# Patient Record
Sex: Female | Born: 1970 | Race: Asian | Hispanic: No | Marital: Married | Smoking: Current every day smoker
Health system: Southern US, Community
[De-identification: ages and names within clinical notes are randomized; demographics above are authoritative.]

## PROBLEM LIST (undated history)

## (undated) DIAGNOSIS — F329 Major depressive disorder, single episode, unspecified: Secondary | ICD-10-CM

## (undated) DIAGNOSIS — E119 Type 2 diabetes mellitus without complications: Secondary | ICD-10-CM

## (undated) DIAGNOSIS — R011 Cardiac murmur, unspecified: Secondary | ICD-10-CM

## (undated) DIAGNOSIS — I10 Essential (primary) hypertension: Secondary | ICD-10-CM

## (undated) DIAGNOSIS — F410 Panic disorder [episodic paroxysmal anxiety] without agoraphobia: Secondary | ICD-10-CM

## (undated) DIAGNOSIS — I350 Nonrheumatic aortic (valve) stenosis: Secondary | ICD-10-CM

## (undated) DIAGNOSIS — F429 Obsessive-compulsive disorder, unspecified: Secondary | ICD-10-CM

## (undated) DIAGNOSIS — R519 Headache, unspecified: Secondary | ICD-10-CM

## (undated) DIAGNOSIS — F419 Anxiety disorder, unspecified: Secondary | ICD-10-CM

## (undated) DIAGNOSIS — F909 Attention-deficit hyperactivity disorder, unspecified type: Secondary | ICD-10-CM

## (undated) DIAGNOSIS — F411 Generalized anxiety disorder: Secondary | ICD-10-CM

## (undated) DIAGNOSIS — E1165 Type 2 diabetes mellitus with hyperglycemia: Secondary | ICD-10-CM

## (undated) DIAGNOSIS — F4310 Post-traumatic stress disorder, unspecified: Secondary | ICD-10-CM

## (undated) DIAGNOSIS — K5900 Constipation, unspecified: Secondary | ICD-10-CM

## (undated) DIAGNOSIS — M1990 Unspecified osteoarthritis, unspecified site: Secondary | ICD-10-CM

## (undated) HISTORY — DX: Nonrheumatic aortic (valve) stenosis: I35.0

## (undated) HISTORY — DX: Essential (primary) hypertension: I10

## (undated) HISTORY — DX: Generalized anxiety disorder: F41.1

## (undated) HISTORY — DX: Type 2 diabetes mellitus without complications: E11.9

## (undated) HISTORY — PX: WISDOM TOOTH EXTRACTION: SHX21

## (undated) HISTORY — DX: Major depressive disorder, single episode, unspecified: F32.9

## (undated) HISTORY — DX: Cardiac murmur, unspecified: R01.1

## (undated) HISTORY — DX: Post-traumatic stress disorder, unspecified: F43.10

## (undated) HISTORY — DX: Type 2 diabetes mellitus with hyperglycemia: E11.65

---

## 2001-11-10 ENCOUNTER — Encounter: Admission: RE | Admit: 2001-11-10 | Discharge: 2001-11-10 | Payer: Self-pay | Admitting: Internal Medicine

## 2003-01-06 ENCOUNTER — Encounter: Admission: RE | Admit: 2003-01-06 | Discharge: 2003-04-06 | Payer: Self-pay | Admitting: Family Medicine

## 2003-05-25 ENCOUNTER — Encounter: Admission: RE | Admit: 2003-05-25 | Discharge: 2003-06-16 | Payer: Self-pay | Admitting: Family Medicine

## 2006-01-03 ENCOUNTER — Emergency Department (HOSPITAL_COMMUNITY): Admission: EM | Admit: 2006-01-03 | Discharge: 2006-01-03 | Payer: Self-pay | Admitting: Emergency Medicine

## 2008-11-15 ENCOUNTER — Encounter: Payer: Self-pay | Admitting: Cardiology

## 2008-12-16 ENCOUNTER — Ambulatory Visit (HOSPITAL_COMMUNITY): Admission: RE | Admit: 2008-12-16 | Discharge: 2008-12-16 | Payer: Self-pay | Admitting: Obstetrics and Gynecology

## 2009-01-10 ENCOUNTER — Ambulatory Visit (HOSPITAL_COMMUNITY): Admission: RE | Admit: 2009-01-10 | Discharge: 2009-01-10 | Payer: Self-pay | Admitting: Obstetrics and Gynecology

## 2009-01-12 DIAGNOSIS — I359 Nonrheumatic aortic valve disorder, unspecified: Secondary | ICD-10-CM | POA: Insufficient documentation

## 2009-01-12 HISTORY — DX: Nonrheumatic aortic valve disorder, unspecified: I35.9

## 2009-01-16 DIAGNOSIS — I10 Essential (primary) hypertension: Secondary | ICD-10-CM | POA: Insufficient documentation

## 2009-01-16 DIAGNOSIS — E119 Type 2 diabetes mellitus without complications: Secondary | ICD-10-CM

## 2009-01-17 ENCOUNTER — Ambulatory Visit: Payer: Self-pay | Admitting: Cardiology

## 2009-01-31 ENCOUNTER — Ambulatory Visit (HOSPITAL_COMMUNITY): Admission: RE | Admit: 2009-01-31 | Discharge: 2009-01-31 | Payer: Self-pay | Admitting: Obstetrics and Gynecology

## 2009-02-09 ENCOUNTER — Inpatient Hospital Stay (HOSPITAL_COMMUNITY): Admission: AD | Admit: 2009-02-09 | Discharge: 2009-02-09 | Payer: Self-pay | Admitting: Obstetrics and Gynecology

## 2009-02-24 ENCOUNTER — Ambulatory Visit (HOSPITAL_COMMUNITY): Admission: RE | Admit: 2009-02-24 | Discharge: 2009-02-24 | Payer: Self-pay | Admitting: Obstetrics and Gynecology

## 2009-03-03 ENCOUNTER — Inpatient Hospital Stay (HOSPITAL_COMMUNITY): Admission: AD | Admit: 2009-03-03 | Discharge: 2009-03-03 | Payer: Self-pay | Admitting: Obstetrics and Gynecology

## 2009-03-06 ENCOUNTER — Inpatient Hospital Stay (HOSPITAL_COMMUNITY): Admission: AD | Admit: 2009-03-06 | Discharge: 2009-03-09 | Payer: Self-pay | Admitting: Obstetrics and Gynecology

## 2009-03-07 ENCOUNTER — Encounter (HOSPITAL_COMMUNITY): Payer: Self-pay | Admitting: Obstetrics and Gynecology

## 2010-02-12 ENCOUNTER — Encounter (INDEPENDENT_AMBULATORY_CARE_PROVIDER_SITE_OTHER): Payer: Self-pay | Admitting: *Deleted

## 2010-04-08 ENCOUNTER — Encounter: Payer: Self-pay | Admitting: Obstetrics

## 2010-04-17 NOTE — Letter (Signed)
Summary: Appointment - Reminder 2  [facility] [place]. [address], [place] [address]   Phone: [phone]  Fax: [phone]     February 12, 2010 MRN: [id number]   JENNALYN CAWLEY [address] [place], [place]  [address]   Dear Ms. Drue Dun,  Our records indicate that it is time to schedule a follow-up appointment.  Dr.    Daleen Squibb      recommended that you follow up with [place] in  01/2010 PAST DUE          . It is very important that we reach you to schedule this appointment. We look forward to participating in your health care needs. Please contact us at the number listed above at your earliest convenience to schedule your appointment.  If you are unable to make an appointment at this time, give [place] a call so we can update our records.     Sincerely,   Glass blower/designer

## 2010-06-18 LAB — LACTATE DEHYDROGENASE: LDH: 134 U/L (ref 94–250)

## 2010-06-18 LAB — CBC
HCT: 38.5 % (ref 36.0–46.0)
HCT: 40.3 % (ref 36.0–46.0)
MCHC: 33.3 g/dL (ref 30.0–36.0)
MCHC: 33.5 g/dL (ref 30.0–36.0)
MCHC: 34.5 g/dL (ref 30.0–36.0)
MCV: 80.8 fL (ref 78.0–100.0)
MCV: 81.4 fL (ref 78.0–100.0)
Platelets: 208 10*3/uL (ref 150–400)
Platelets: 214 10*3/uL (ref 150–400)
RBC: 3.89 MIL/uL (ref 3.87–5.11)
RBC: 4.77 MIL/uL (ref 3.87–5.11)
RDW: 14.7 % (ref 11.5–15.5)
WBC: 14.4 10*3/uL — ABNORMAL HIGH (ref 4.0–10.5)

## 2010-06-18 LAB — GLUCOSE, CAPILLARY
Glucose-Capillary: 102 mg/dL — ABNORMAL HIGH (ref 70–99)
Glucose-Capillary: 114 mg/dL — ABNORMAL HIGH (ref 70–99)
Glucose-Capillary: 114 mg/dL — ABNORMAL HIGH (ref 70–99)
Glucose-Capillary: 138 mg/dL — ABNORMAL HIGH (ref 70–99)
Glucose-Capillary: 79 mg/dL (ref 70–99)
Glucose-Capillary: 81 mg/dL (ref 70–99)
Glucose-Capillary: 95 mg/dL (ref 70–99)

## 2010-06-18 LAB — COMPREHENSIVE METABOLIC PANEL
AST: 21 U/L (ref 0–37)
BUN: 9 mg/dL (ref 6–23)
CO2: 23 mEq/L (ref 19–32)
Calcium: 8.8 mg/dL (ref 8.4–10.5)
Creatinine, Ser: 0.42 mg/dL (ref 0.4–1.2)
GFR calc Af Amer: >60 mL/min (ref >60)
GFR calc non Af Amer: >60 mL/min (ref >60)
Glucose, Bld: 158 mg/dL — ABNORMAL HIGH (ref 70–99)

## 2010-06-18 LAB — URINE MICROSCOPIC-ADD ON

## 2010-06-18 LAB — URINALYSIS, ROUTINE W REFLEX MICROSCOPIC
Protein, ur: 100 mg/dL — AB
Urobilinogen, UA: 0.2 mg/dL (ref 0.0–1.0)

## 2010-06-18 LAB — URIC ACID: Uric Acid, Serum: 4.9 mg/dL (ref 2.4–7.0)

## 2010-06-18 LAB — RPR: RPR Ser Ql: NONREACTIVE

## 2010-09-25 ENCOUNTER — Encounter: Payer: Self-pay | Admitting: Cardiology

## 2013-09-15 DIAGNOSIS — F4322 Adjustment disorder with anxiety: Secondary | ICD-10-CM | POA: Insufficient documentation

## 2013-09-15 DIAGNOSIS — M479 Spondylosis, unspecified: Secondary | ICD-10-CM | POA: Insufficient documentation

## 2013-09-15 DIAGNOSIS — L811 Chloasma: Secondary | ICD-10-CM

## 2013-09-15 DIAGNOSIS — L853 Xerosis cutis: Secondary | ICD-10-CM

## 2013-09-15 DIAGNOSIS — D239 Other benign neoplasm of skin, unspecified: Secondary | ICD-10-CM

## 2013-09-15 DIAGNOSIS — E669 Obesity, unspecified: Secondary | ICD-10-CM

## 2013-09-15 DIAGNOSIS — I517 Cardiomegaly: Secondary | ICD-10-CM | POA: Insufficient documentation

## 2013-09-15 HISTORY — DX: Spondylosis, unspecified: M47.9

## 2013-09-15 HISTORY — DX: Obesity, unspecified: E66.9

## 2013-09-15 HISTORY — DX: Chloasma: L81.1

## 2013-09-15 HISTORY — DX: Xerosis cutis: L85.3

## 2013-09-15 HISTORY — DX: Cardiomegaly: I51.7

## 2013-09-15 HISTORY — DX: Other benign neoplasm of skin, unspecified: D23.9

## 2013-09-15 HISTORY — DX: Adjustment disorder with anxiety: F43.22

## 2013-12-09 DIAGNOSIS — L989 Disorder of the skin and subcutaneous tissue, unspecified: Secondary | ICD-10-CM | POA: Insufficient documentation

## 2013-12-09 HISTORY — DX: Disorder of the skin and subcutaneous tissue, unspecified: L98.9

## 2016-03-13 DIAGNOSIS — R1084 Generalized abdominal pain: Secondary | ICD-10-CM | POA: Insufficient documentation

## 2016-03-13 DIAGNOSIS — J208 Acute bronchitis due to other specified organisms: Secondary | ICD-10-CM

## 2016-03-13 HISTORY — DX: Acute bronchitis due to other specified organisms: J20.8

## 2016-03-13 HISTORY — DX: Generalized abdominal pain: R10.84

## 2016-04-18 DIAGNOSIS — H40013 Open angle with borderline findings, low risk, bilateral: Secondary | ICD-10-CM | POA: Insufficient documentation

## 2016-04-18 HISTORY — DX: Open angle with borderline findings, low risk, bilateral: H40.013

## 2017-06-05 DIAGNOSIS — Z79899 Other long term (current) drug therapy: Secondary | ICD-10-CM

## 2017-06-05 HISTORY — DX: Other long term (current) drug therapy: Z79.899

## 2017-06-27 DIAGNOSIS — F5101 Primary insomnia: Secondary | ICD-10-CM

## 2017-06-27 HISTORY — DX: Primary insomnia: F51.01

## 2019-05-08 DIAGNOSIS — S0990XA Unspecified injury of head, initial encounter: Secondary | ICD-10-CM | POA: Insufficient documentation

## 2019-05-08 HISTORY — DX: Unspecified injury of head, initial encounter: S09.90XA

## 2019-05-20 DIAGNOSIS — M546 Pain in thoracic spine: Secondary | ICD-10-CM | POA: Insufficient documentation

## 2019-05-20 HISTORY — DX: Pain in thoracic spine: M54.6

## 2019-06-11 ENCOUNTER — Ambulatory Visit: Payer: Self-pay | Admitting: Orthopedic Surgery

## 2019-06-11 NOTE — H&P (Signed)
Subjective:   Location: right (forearm) Duration: DOI- 05-08-19 Quality: burning; worsening Severity: pain level 12/10 (in right arm) Context: work injury (rolled down steps) Alleviating Factors: nothing helps; Ibu, Voltaren gel, bed rest for 3 weeks Aggravating Factors: walking Associated Symptoms: weakness (in general); numbness (right arm) Previous Surgery: none (for neck or back) Prior Imaging: MRI (@ [facility]) Previous PT: none Work Related: yes Working: no Dr Tonita Cong referral  Patient Active Problem List   Diagnosis Date Noted  . DIABETES MELLITUS, TYPE II, ON INSULIN, CONTROLLED 01/16/2009  . HYPERTENSION, ESSENTIAL, CHRONIC 01/16/2009  . AORTIC STENOSIS 01/12/2009   Past Medical History:  Diagnosis Date  . Aortic stenosis   . DM2 (diabetes mellitus, type 2)    on insulin, controlled  . Unspecified essential hypertension      Current Outpatient Medications  Medication Sig Dispense Refill Last Dose  . calcium carbonate (CALCIUM 500) 1250 MG tablet Take 1 tablet by mouth daily.       . insulin glargine (LANTUS SOLOSTAR) 100 UNIT/ML injection Inject into the skin as directed.       . insulin lispro (HUMALOG KWIKPEN) 100 UNIT/ML injection Inject into the skin as directed.       . metFORMIN (GLUCOPHAGE-XR) 750 MG 24 hr tablet Take 750 mg by mouth 3 (three) times daily.       Marland Kitchen NIFEdipine (PROCARDIA XL) 30 MG (OSM) 24 hr tablet Take 30 mg by mouth at bedtime.       Marland Kitchen NIFEdipine (PROCARDIA XL) 90 MG (OSM) 24 hr tablet Take 90 mg by mouth every morning.       . Omega-3 Fatty Acids (FISH OIL) 1000 MG CAPS Take by mouth daily.       . Prenatal Multivit-Min-Fe-FA (PRENATAL VITAMINS) 0.8 MG tablet Take 1 tablet by mouth daily.        No current facility-administered medications for this visit.   Not on File  Social History   Tobacco Use  . Smoking status: Not on file  Substance Use Topics  . Alcohol use: Yes    Comment: Not during pregnancy though     Family History  Problem  Relation Age of Onset  . Hypertension Mother   . Coronary artery disease Father   . Coronary artery disease Paternal Aunt   . Coronary artery disease Paternal Aunt   . Cancer Other     Review of Systems As stated in HPI  Objective:   Vitals: Ht: 4 ft 8 in Wt: 142 lbs BMI: 31.8  Clinical exam: Cynthia Wilkinson is a pleasant individual, who appears younger than their stated age. She is alert and orientated 3. No shortness of breath, chest pain. Abdomen is soft and non-tender, negative loss of bowel and bladder control, no rebound tenderness. Negative: skin lesions abrasions contusions  Lungs: Clear to auscultation bilaterally  Cardiac: Regular rate and rhythm. No rubs gallops murmurs  Peripheral pulses: 2+ radial, dorsalis pedis, posterior tibialis pulses bilaterally. Compartment soft and nontender.  Gait pattern: Ataxic gait pattern unable to ambulate heel toe  Assistive devices: Currently using a cane for balance.  Neuro: Right upper extremity: 5/5 deltoid strength, 4/5 bicep, wrist extensor, and tricep strength. 5/5 upper extremity strength in the left arm except for trace weakness of grip strength bilaterally. Babinski test, positive Hoffman test, brisk lower extremity deep tendon reflexes at the knee and Achilles, positive clonus in the lower extremity. Negative inverted brachioradialis reflex. Diminished proprioception in all extremities.  Musculoskeletal: Significant neck pain especially with forward  flexion. Patient notes upper and lower extremity Lhermitte sign with forward flexion of the neck. No significant shoulder, elbow, wrist pain with isolated joint range of motion.  X-rays of the cervical spine taken on 05/21/19 were reviewed. Loss of normal cervical lordosis is noted. She has overall slightly kyphotic angulation apex is at C5-6. There is mild degenerative changes at the C5-6 disc space level. No significant collapse of the C3-5 disks. Unable to visualize the C7-T1  junction.  Cervical MRI: completed on 06/09/19 was reviewed with the patient. It was completed at [facility]; I have independently reviewed the images as well as the radiology report. Positive cord signal change consistent with myelomalacia at the C5-6 disc space level. Loss of normal cervical lordosis is noted. Slight grade 1 retrolisthesis C5 on 6. No disc herniation or stenosis C2-C5. Large right paracentral disc extrusion C5-6 causing compression of the canal and the exiting C6 nerve root. Moderate right paracentral disc extrusion C6-7 but no foraminal stenosis. There is still flattening of the ventral cord from C5-C7.   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 Since this time she is noted progressive weakness in the right upper extremity as well as radicular pain, and difficulty walking. Her balance has deteriorated and over the last 2 weeks she is now using a cane. Imaging studies demonstrated a large disc herniation at C5-6 and slightly smaller at C6-7. There is significant cord edema consistent with myelomalacia at the C5-6 level. She does have a central disc protrusion at C4-5 but there is still CSF signal anterior to the cord and she does not have any C5 radicular symptoms. Clinically the patient has signs and symptoms of cervical myelopathy that has progressed since the date of her injury. I have gone over the pathology with her and her husband in great detail. The natural history of myelopathy is progression as she has demonstrated over the last 3 weeks. The goal of surgery is to remove the pressure from the cervical spine and prevent worsening of her symptoms. I indicated that there may be a chance that she can improve but the goal is to keep her from progressively deteriorating.  Plan:   Risks and benefits of surgery were discussed with the patient. These include: Infection, bleeding, death, stroke, paralysis, ongoing or worse  pain, need for additional surgery, nonunion, leak of spinal fluid, adjacent segment degeneration requiring additional fusion surgery. Pseudoarthrosis (nonunion)requiring supplemental posterior fixation. Throat pain, swallowing difficulties, hoarseness or change in voice.  Postoperatively, since this is a multilevel procedure and she has a history of nicotine use as well as diabetes we will use postoperative external bone stimulator. She will require a Aspen collar for 4-6 weeks, and it is unlikely that she will be able to return to gainful employment for a minimum of 3 months. I would expect her to be at [facility] at approximately one year.  Treatment plan: We will obtain preoperative medical clearance from her primary care physician and move forward with surgery next week.

## 2019-06-11 NOTE — H&P (Deleted)
  The note originally documented on this encounter has been moved the the encounter in which it belongs.  

## 2019-06-14 ENCOUNTER — Encounter (HOSPITAL_COMMUNITY): Payer: Self-pay

## 2019-06-14 NOTE — Progress Notes (Signed)
[id number] - [place], [place] - [address] AT [place] [address] Phone: [phone] Fax: [phone]    Your procedure is scheduled on Thursday, April 1st.  Report to [facility] Main Entrance "A" at 10:00 A.M., and check in at the Admitting office.  Call this number if you have problems the morning of surgery:  [phone]  Call [phone] if you have any questions prior to your surgery date Monday-Friday 8am-4pm   Remember:  Do not eat or drink after midnight the night before your surgery   Take these medicines the morning of surgery with A SIP OF WATER  metoprolol tartrate (LOPRESSOR)   If needed - ALPRAZolam [name]   Follow your surgeon's instructions on when to stop Aspirin.  If no instructions were given by your surgeon then you will need to call the office to get those instructions.     As of today, STOP taking other Aspirin containing products, diclofenac Sodium (VOLTAREN), Vitamins, Fish Oils, and Herbal Medications. Also stop all NSAIDS i.e. Advil, Ibuprofen, Motrin, Aleve, Anaprox, Naproxen, BC, Goody Powders, and all Supplements.   WHAT DO I DO ABOUT MY DIABETES MEDICATION?  Marland Kitchen Do not take oral diabetes medicines (pills) the morning of surgery.  . The day of surgery, do not take other diabetes injectables, including Victoza (liraglutide), Byetta (exenatide), Bydureon (exenatide ER), or Trulicity (dulaglutide).  . If your CBG is greater than 220 mg/dL, you may take  of your sliding scale (correction) dose of insulin.  HOW TO MANAGE YOUR DIABETES BEFORE AND AFTER SURGERY  Why is it important to control my blood sugar before and after surgery? . Improving blood sugar levels before and after surgery helps healing and can limit problems. . A way of improving blood sugar control is eating a healthy diet by: o  Eating less sugar and carbohydrates o  Increasing activity/exercise o  Talking with your  doctor about reaching your blood sugar goals . High blood sugars (greater than 180 mg/dL) can raise your risk of infections and slow your recovery, so you will need to focus on controlling your diabetes during the weeks before surgery. . Make sure that the doctor who takes care of your diabetes knows about your planned surgery including the date and location.  How do I manage my blood sugar before surgery? . Check your blood sugar at least 4 times a day, starting 2 days before surgery, to make sure that the level is not too high or low. . Check your blood sugar the morning of your surgery when you wake up and every 2 hours until you get to the Short Stay unit. o If your blood sugar is less than 70 mg/dL, you will need to treat for low blood sugar: - Do not take insulin. - Treat a low blood sugar (less than 70 mg/dL) with  cup of clear juice (cranberry or apple), 4 glucose tablets, OR glucose gel. - Recheck blood sugar in 15 minutes after treatment (to make sure it is greater than 70 mg/dL). If your blood sugar is not greater than 70 mg/dL on recheck, call [phone] for further instructions. . Report your blood sugar to the short stay nurse when you get to Short Stay.  . If you are admitted to the hospital after surgery: o Your blood sugar will be checked by the staff and you will probably be given insulin after surgery (instead of oral diabetes medicines) to make sure you  have good blood sugar levels. o The goal for blood sugar control after surgery is 80-180 mg/dL.  No Smoking of any kind, Tobacco, or Alcohol products 24 hours prior to your procedure. If you use a CPAP at night, you may bring all equipment for your overnight stay.                        Do not wear jewelry, make up, or nail polish            Do not wear lotions, powders, perfumes, or deodorant.            Do not shave 48 hours prior to surgery.              Do not bring valuables to the hospital.            [facility]  is not responsible for any belongings or valuables.   Contacts, glasses, dentures or bridgework may not be worn into surgery.      For patients admitted to the hospital, discharge time will be determined by your treatment team.   Patients discharged the day of surgery will not be allowed to drive home, and someone needs to stay with them for 24 hours.  Special instructions:   [facility]- Preparing For Surgery  Before surgery, you can play an important role. Because skin is not sterile, your skin needs to be as free of germs as possible. You can reduce the number of germs on your skin by washing with CHG (chlorahexidine gluconate) Soap before surgery.  CHG is an antiseptic cleaner which kills germs and bonds with the skin to continue killing germs even after washing.    Oral Hygiene is also important to reduce your risk of infection.  Remember - BRUSH YOUR TEETH THE MORNING OF SURGERY WITH YOUR REGULAR TOOTHPASTE  Please do not use if you have an allergy to CHG or antibacterial soaps. If your skin becomes reddened/irritated stop using the CHG.  Do not shave (including legs and underarms) for at least 48 hours prior to first CHG shower. It is OK to shave your face.  Please follow these instructions carefully.   1. Shower the NIGHT BEFORE SURGERY and the MORNING OF SURGERY with CHG Soap.   2. If you chose to wash your hair, wash your hair first as usual with your normal shampoo.  3. After you shampoo, rinse your hair and body thoroughly to remove the shampoo.  4. Use CHG as you would any other liquid soap. You can apply CHG directly to the skin and wash gently with a scrungie or a clean washcloth.   5. Apply the CHG Soap to your body ONLY FROM THE NECK DOWN.  Do not use on open wounds or open sores. Avoid contact with your eyes, ears, mouth and genitals (private parts). Wash Face and genitals (private parts)  with your normal soap.   6. Wash thoroughly, paying special attention to the  area where your surgery will be performed.  7. Thoroughly rinse your body with warm water from the neck down.  8. DO NOT shower/wash with your normal soap after using and rinsing off the CHG Soap.  9. Pat yourself dry with a CLEAN TOWEL.  10. Wear CLEAN PAJAMAS to bed the night before surgery, wear comfortable clothes the morning of surgery  11. Place CLEAN SHEETS on your bed the night of your first shower and DO NOT SLEEP WITH PETS.  Day of Surgery:  Do not apply any deodorants/lotions.  Please wear clean clothes to the hospital/surgery center.   Remember to brush your teeth WITH YOUR REGULAR TOOTHPASTE.   Please read over the following fact sheets that you were given.

## 2019-06-14 NOTE — Progress Notes (Signed)
Your procedure is scheduled on Thursday, April 1st.  Report to [facility] Main Entrance "A" at 10:00 A.M., and check in at the Admitting office. Your surgery or procedure is scheduled for 12 noon.   Call this number if you have problems the morning of surgery:  [phone]  Call [phone] if you have any questions prior to your surgery date Monday-Friday 8am-4pm   Remember:  Do not eat or drink after midnight the night before your surgery   Take these medicines the morning of surgery with A SIP OF WATER  metoprolol tartrate (LOPRESSOR)   If needed - AlPRAZolam Cynthia Wilkinson)   DO not take Victoza the morning of surgery  Follow your surgeon's instructions on when to stop Aspirin.  If no instructions were given by your surgeon then you will need to call the office to get those instructions.     As of today, STOP taking other Aspirin containing products, diclofenac Sodium (VOLTAREN), Vitamins, Fish Oils, and Herbal Medications. Also stop all NSAIDS i.e. Advil, Ibuprofen, Motrin, Aleve, Anaprox, Naproxen, BC, Goody Powders, and all Supplements.   WHAT DO I DO ABOUT MY DIABETES MEDICATION?  Marland Kitchen Do not take oral diabetes medicines (pills) the morning of surgery.  DO NOT take Victoza the day before or the morning of surgery. . The day of surgery, do not take other diabetes injectables, including Victoza (liraglutide), Byetta (exenatide), Bydureon (exenatide ER), or Trulicity (dulaglutide).  . If your CBG is greater than 220 mg/dL, you may take  of your sliding scale (correction) dose of insulin.  HOW TO MANAGE YOUR DIABETES BEFORE AND AFTER SURGERY  Why is it important to control my blood sugar before and after surgery? . Improving blood sugar levels before and after surgery helps healing and can limit problems. . A way of improving blood sugar control is eating a healthy diet by: o  Eating less sugar and carbohydrates o  Increasing activity/exercise o  Talking with your doctor about  reaching your blood sugar goals . High blood sugars (greater than 180 mg/dL) can raise your risk of infections and slow your recovery, so you will need to focus on controlling your diabetes during the weeks before surgery. . Make sure that the doctor who takes care of your diabetes knows about your planned surgery including the date and location.  How do I manage my blood sugar before surgery? . Check your blood sugar at least 4 times a day, starting 2 days before surgery, to make sure that the level is not too high or low. . Check your blood sugar the morning of your surgery when you wake up and every 2 hours until you get to the Short Stay unit. o If your blood sugar is less than 70 mg/dL, you will need to treat for low blood sugar: - Do not take insulin. - Treat a low blood sugar (less than 70 mg/dL) with  cup of clear juice (cranberry or apple), 4 glucose tablets, OR glucose gel. - Recheck blood sugar in 15 minutes after treatment (to make sure it is greater than 70 mg/dL). If your blood sugar is not greater than 70 mg/dL on recheck, call [phone] for further instructions. . Report your blood sugar to the short stay nurse when you get to Short Stay.   Special instructions:   [facility]- Preparing For Surgery  Before surgery, you can play an important role. Because skin is not sterile, your skin needs to be as free of germs as possible. You  can reduce the number of germs on your skin by washing with CHG (chlorahexidine gluconate) Soap before surgery.  CHG is an antiseptic cleaner which kills germs and bonds with the skin to continue killing germs even after washing.    Oral Hygiene is also important to reduce your risk of infection.  Remember - BRUSH YOUR TEETH THE MORNING OF SURGERY WITH YOUR REGULAR TOOTHPASTE  Please do not use if you have an allergy to CHG or antibacterial soaps. If your skin becomes reddened/irritated stop using the CHG.  Do not shave (including legs and  underarms) for at least 48 hours prior to first CHG shower. It is OK to shave your face.  Please follow these instructions carefully.   1. Shower the NIGHT BEFORE SURGERY and the MORNING OF SURGERY with CHG Soap.   2. If you chose to wash your hair, wash your hair first as usual with your normal shampoo.  3. After you shampoo, wash your face and private area with the soap you use at home, then rinse your hair and body thoroughly to remove the shampoo and soap.    4. Use CHG as you would any other liquid soap. You can apply CHG directly to the skin and wash gently with a scrungie or a clean washcloth.   5. Apply the CHG Soap to your body ONLY FROM THE NECK DOWN.  Do not use on open wounds or open sores. Avoid contact with your eyes, ears, mouth and genitals (private parts).   6. Wash thoroughly, paying special attention to the area where your surgery will be performed.  7. Thoroughly rinse your body with warm water from the neck down.  8. DO NOT shower/wash with your normal soap after using and rinsing off the CHG Soap.  9. Pat yourself dry with a CLEAN TOWEL.  10. Wear CLEAN PAJAMAS to bed the night before surgery, wear comfortable clothes the morning of surgery  11. Place CLEAN SHEETS on your bed the night of your first shower and DO NOT SLEEP WITH PETS.  Day of Surgery: Shower as instructed above.  Do not apply any deodorants/lotions.  Please wear clean clothes to the hospital/surgery center.   Remember to brush your teeth WITH YOUR REGULAR TOOTHPASTE.  No Smoking of any kind, Tobacco, or Alcohol products 24 hours prior to your procedure. If you use a CPAP at night, you may bring all equipment for your overnight stay.                        Do not wear jewelry, make up, or nail polish            Do not wear lotions, powders, perfumes, or deodorant.            Do not shave 48 hours prior to surgery.              Do not bring valuables to the hospital.            [facility] is  not responsible for any belongings or valuables.   Contacts, glasses, dentures or bridgework may not be worn into surgery.      For patients admitted to the hospital, discharge time will be determined by your treatment team.   Patients discharged the day of surgery will not be allowed to drive home, and someone needs to stay with them for 24 hours. Please read over the following fact sheets that you were given.

## 2019-06-15 ENCOUNTER — Other Ambulatory Visit (HOSPITAL_COMMUNITY)
Admission: RE | Admit: 2019-06-15 | Discharge: 2019-06-15 | Disposition: A | Discharge status: Home / Self Care | Source: Ambulatory Visit | Attending: Orthopedic Surgery | Admitting: Orthopedic Surgery

## 2019-06-15 ENCOUNTER — Ambulatory Visit (HOSPITAL_COMMUNITY)
Admission: RE | Admit: 2019-06-15 | Discharge: 2019-06-15 | Disposition: A | Discharge status: Home / Self Care | Source: Ambulatory Visit | Attending: Orthopedic Surgery | Admitting: Orthopedic Surgery

## 2019-06-15 ENCOUNTER — Other Ambulatory Visit: Payer: Self-pay

## 2019-06-15 ENCOUNTER — Encounter (HOSPITAL_COMMUNITY): Payer: Self-pay

## 2019-06-15 ENCOUNTER — Encounter (HOSPITAL_COMMUNITY)
Admission: RE | Admit: 2019-06-15 | Discharge: 2019-06-15 | Disposition: A | Discharge status: Home / Self Care | Source: Ambulatory Visit | Attending: Orthopedic Surgery | Admitting: Orthopedic Surgery

## 2019-06-15 DIAGNOSIS — Z20822 Contact with and (suspected) exposure to covid-19: Secondary | ICD-10-CM | POA: Diagnosis not present

## 2019-06-15 DIAGNOSIS — Z01818 Encounter for other preprocedural examination: Secondary | ICD-10-CM

## 2019-06-15 DIAGNOSIS — Z01810 Encounter for preprocedural cardiovascular examination: Secondary | ICD-10-CM | POA: Insufficient documentation

## 2019-06-15 HISTORY — DX: Anxiety disorder, unspecified: F41.9

## 2019-06-15 HISTORY — DX: Constipation, unspecified: K59.00

## 2019-06-15 HISTORY — DX: Attention-deficit hyperactivity disorder, unspecified type: F90.9

## 2019-06-15 HISTORY — DX: Panic disorder (episodic paroxysmal anxiety): F41.0

## 2019-06-15 HISTORY — DX: Obsessive-compulsive disorder, unspecified: F42.9

## 2019-06-15 HISTORY — DX: Cardiac murmur, unspecified: R01.1

## 2019-06-15 HISTORY — DX: Unspecified osteoarthritis, unspecified site: M19.90

## 2019-06-15 HISTORY — DX: Headache, unspecified: R51.9

## 2019-06-15 LAB — CBC
HCT: 40.6 % (ref 36.0–46.0)
Hemoglobin: 12.7 g/dL (ref 12.0–15.0)
MCH: 24.5 pg — ABNORMAL LOW (ref 26.0–34.0)
MCHC: 31.3 g/dL (ref 30.0–36.0)
MCV: 78.4 fL — ABNORMAL LOW (ref 80.0–100.0)
Platelets: 398 10*3/uL (ref 150–400)
RBC: 5.18 MIL/uL — ABNORMAL HIGH (ref 3.87–5.11)
RDW: 13 % (ref 11.5–15.5)
WBC: 10 10*3/uL (ref 4.0–10.5)
nRBC: 0 % (ref 0.0–0.2)

## 2019-06-15 LAB — URINALYSIS, ROUTINE W REFLEX MICROSCOPIC
Bilirubin Urine: NEGATIVE
Glucose, UA: 50 mg/dL — AB
Ketones, ur: NEGATIVE mg/dL
Leukocytes,Ua: NEGATIVE
Nitrite: NEGATIVE
Protein, ur: NEGATIVE mg/dL
Specific Gravity, Urine: 1.014 (ref 1.005–1.030)
pH: 5 (ref 5.0–8.0)

## 2019-06-15 LAB — SURGICAL PCR SCREEN
MRSA, PCR: NEGATIVE
Staphylococcus aureus: POSITIVE — AB

## 2019-06-15 LAB — BASIC METABOLIC PANEL
Anion gap: 12 (ref 5–15)
BUN: 13 mg/dL (ref 6–20)
CO2: 21 mmol/L — ABNORMAL LOW (ref 22–32)
Calcium: 8.9 mg/dL (ref 8.9–10.3)
Chloride: 101 mmol/L (ref 98–111)
Creatinine, Ser: 0.51 mg/dL (ref 0.44–1.00)
GFR calc Af Amer: >60 mL/min (ref >60)
GFR calc non Af Amer: >60 mL/min (ref >60)
Glucose, Bld: 176 mg/dL — ABNORMAL HIGH (ref 70–99)
Potassium: 4.4 mmol/L (ref 3.5–5.1)
Sodium: 134 mmol/L — ABNORMAL LOW (ref 135–145)

## 2019-06-15 LAB — HEMOGLOBIN A1C
Hgb A1c MFr Bld: 7.8 % — ABNORMAL HIGH (ref 4.8–5.6)
Mean Plasma Glucose: 177.16 mg/dL

## 2019-06-15 LAB — GLUCOSE, CAPILLARY: Glucose-Capillary: 176 mg/dL — ABNORMAL HIGH (ref 70–99)

## 2019-06-15 LAB — APTT: aPTT: 29 seconds (ref 24–36)

## 2019-06-15 LAB — SARS CORONAVIRUS 2 (TAT 6-24 HRS): SARS Coronavirus 2: NEGATIVE

## 2019-06-15 LAB — PROTIME-INR
INR: 0.9 (ref 0.8–1.2)
Prothrombin Time: 12.5 seconds (ref 11.4–15.2)

## 2019-06-15 NOTE — Progress Notes (Addendum)
Anesthesia Note:   Case: [id number] Date/Time: 06/17/19 1145   Procedure: ANTERIOR CERVICAL DECOMPRESSION/DISCECTOMY FUSION C5-7 (N/A ) - 3 hrs   Anesthesia type: General   Pre-op diagnosis: Cervical myeloradiculopathy   Location: [facility] OR ROOM 04 / [place] [facility]   Surgeons: Melina Schools, MD      DISCUSSION: Patient is a 49 year old female scheduled for the above procedure.  History includes smoking, HTN, DM2, OCD, anxiety with panic attacks, ADHD. She had echo in 2010 while pregnant due to murmur--minimal AS by 11/15/08 echo. BMI is consistent with obesity.  ASA on hold as of 06/15/19.   She had an echo during 2010 pregnancy that was obtained by Karma Ganja, MD in [facility] and showed minimal AS with mean gradient of 8 mmHg and peak gradient of 19 mmHg. Echo was technically difficult and no comment was made on morphology. She was subsequently evaluated by Well, Marcello Moores, MD for clearance for vaginal versus c-section. She reportedly had recent primary care evaluation with EKG and echocardiogram and is scheduled to see cardiologist Dr. Brigitte Pulse at [facility] on 06/15/19.  Cardiology records pending. 06/15/19 CXR and COVID-19 test in process. Chart will be left for follow-up.    ADDENDUM 06/16/19 1:11 PM: Normal CXR. COVID test negative. Clearance notes received from Dr. Royce Macadamia and Dr. Brigitte Pulse. Dr. Brigitte Pulse wrote, " patient stable from cardiovascular standpoint.  No medication changes required.  Patient may proceed to surgery for cervical fusion.  Overall low perioperative risk of cardiovascular complications." I have contacted Courtney at [facility] and have requested echocardiogram and EKG be faxed, as well as office notes once available (were not yet signed/finalized as visit were within ~ 24-48 hours).   ADDENDUM 06/16/19 4:41 PM: I spoke with staff at Dr. Raul Del [facility]) and was told we should receive a faxed copy of patient's EKG and echo prior to 7:30 AM on  06/17/19.   VS: BP 140/86   Pulse 79   Temp 36.9 C (Oral)   Resp 18   Ht 4\' 8"  (1.422 m)   Wt 65.7 kg   LMP 05/31/2019   SpO2 100%   BMI 32.49 kg/m    PROVIDERS: Kellie Shropshire, MD is PCP [facility]) Moises Blood, MD is cardiologist [facility])   LABS: Labs reviewed: Acceptable for surgery. (all labs ordered are listed, but only abnormal results are displayed)  Labs Reviewed  SURGICAL PCR SCREEN - Abnormal; Notable for the following components:      Result Value   Staphylococcus aureus POSITIVE (*)    All other components within normal limits  GLUCOSE, CAPILLARY - Abnormal; Notable for the following components:   Glucose-Capillary 176 (*)    All other components within normal limits  BASIC METABOLIC PANEL - Abnormal; Notable for the following components:   Sodium 134 (*)    CO2 21 (*)    Glucose, Bld 176 (*)    All other components within normal limits  CBC - Abnormal; Notable for the following components:   RBC 5.18 (*)    MCV 78.4 (*)    MCH 24.5 (*)    All other components within normal limits  URINALYSIS, ROUTINE W REFLEX MICROSCOPIC - Abnormal; Notable for the following components:   APPearance HAZY (*)    Glucose, UA 50 (*)    Hgb urine dipstick MODERATE (*)    Bacteria, UA RARE (*)    All other components within normal limits  HEMOGLOBIN A1C -  Abnormal; Notable for the following components:   Hgb A1c MFr Bld 7.8 (*)    All other components within normal limits  APTT  PROTIME-INR     IMAGES: CXR 06/15/19: FINDINGS: Heart size is normal. Mediastinal shadows are normal. The lungs are clear. No bronchial thickening. No infiltrate, mass, effusion or collapse. Pulmonary vascularity is normal. No bony abnormality. IMPRESSION: Normal chest   EKG: Requested from [facility].   CV: Echo 11/15/2008 ([facility], read by Bonnielee Haff, MD; scanned under Media tab, Outside Record, 03/10/09): Conclusions: Quality  of study: Limited and difficult. Normal LV systolic function with mild concentric hypertrophy.  LVEF 71%. Mild LA enlargement. Aortic valve note well visualized.  Sclerotic with minimally restricted mobility.  Velocity across the valve: 202 cm/s.  Gradient across the valve: Mean gradient 6 mmHg: Peak gradient 19 mmHg. No regurgitation. Minimal gradient (stenosis).    Past Medical History:  Diagnosis Date  . ADHD   . Anxiety   . Aortic stenosis   . Arthritis   . Constipation    3/30/ 2021- frequent since injury 04/2019  . DM2 (diabetes mellitus, type 2) (HCC)    on insulin, controlled  . Head injury 05/08/2019   not sure if she lots concious  . Headache   . Heart murmur    when pregnant 2011  . OCD (obsessive compulsive disorder)   . Panic attacks   . Unspecified essential hypertension     Past Surgical History:  Procedure Laterality Date  . CESAREAN SECTION    . WISDOM TOOTH EXTRACTION     had nitroeus     MEDICATIONS: . ALPRAZolam (XANAX) 0.5 MG tablet  . amphetamine-dextroamphetamine (ADDERALL XR) 10 MG 24 hr capsule  . amphetamine-dextroamphetamine (ADDERALL) 10 MG tablet  . Ascorbic Acid (VITAMIN C) 1000 MG tablet  . aspirin EC 81 MG tablet  . calcium carbonate (CALCIUM 500) 1250 MG tablet  . diclofenac Sodium (VOLTAREN) 1 % GEL  . ibuprofen (ADVIL) 200 MG tablet  . insulin regular (NOVOLIN R) 100 units/mL injection  . lisinopril (ZESTRIL) 10 MG tablet  . Melatonin 12 MG TABS  . metoprolol tartrate (LOPRESSOR) 100 MG tablet  . VICTOZA 18 MG/3ML SOPN   No current facility-administered medications for this encounter.    Shonna Chock, PA-C Surgical Short Stay/Anesthesiology [facility] Phone [phone] [facility] Phone [phone] 06/15/2019 4:18 PM

## 2019-06-15 NOTE — Progress Notes (Addendum)
Cynthia Wilkinson denies chest pain or shortness of breath. Patient denies any s/s of Covid in herself or any one she has been around.  PCP - DR. Mikael Spray, [facility]  Cardiologist - Dr. Clelia Croft [facility] - has an appointment today.  Chest x-ray -   EKG - 06/04/2019 at PCP's office - I requested tracing  Stress Test -   ECHO - 06/04/2019- requested  Cardiac Cath -   Sleep Study - no CPAP - no  LABS- CBC, BMP, PT, PTT,  ASA- stopped today  ERAS-no  HA1C-today  Fasting Blood Sugar - 220 Checks Blood Sugar ____1_ times a day  Anesthesia- Shonna Chock , PA- C is reviewing   Pt denies having chest pain, sob, or fever at this time. All instructions explained to the pt, with a verbal understanding of the material. Pt agrees to go over the instructions while at home for a better understanding. Pt also instructed to self quarantine after being tested for COVID-19. The opportunity to ask questions was provided.  Cynthia. Wilkinson states she is constipated, she took a Laxative Sunday 1/3 of bottle of Magnesium Citrate. and has not had a bowel movement in over a week, and she has had some nausea and vommtting. Patient reports that she feels like her entire abdomen is numb, this is new as of Friday. Patient received a call from Dr Shon Baton' office, I asked patient to tell the office about the numbness and constipation that is new since she saw Dr. Shon Baton.  Dr Ebbie Latus on the phone and told patient to go the ED, that she would have surgery today, since the numbness has extended. Cynthia Wilkinson told Dr. Shon Baton that she did not want to have surgery today. Dr. Shon Baton said he keep Thursday surgery, per patient's request. Dr. Shon Baton told patient , yes she may take more laxative, patient told Dr. Shon Baton that she is taking Ibuprofen for pain`, he instructed to take Tylenol is needed , no more Ibuprofen or Aspirin or NSAIDS.

## 2019-06-16 NOTE — Anesthesia Preprocedure Evaluation (Addendum)
Anesthesia Evaluation  Patient identified by MRN, date of birth, ID band Patient awake    Reviewed: Allergy & Precautions, NPO status , Patient's Chart, lab work & pertinent test results  Airway Mallampati: II  TM Distance: >3 FB Neck ROM: Full    Dental  (+) Dental Advisory Given   Pulmonary Current Smoker,    breath sounds clear to auscultation       Cardiovascular hypertension, Pt. on medications and Pt. on home beta blockers  Rhythm:Regular Rate:Normal     Neuro/Psych  Headaches, Anxiety    GI/Hepatic negative GI ROS, Neg liver ROS,   Endo/Other  diabetes, Type 2, Insulin Dependent  Renal/GU negative Renal ROS     Musculoskeletal  (+) Arthritis ,   Abdominal   Peds  Hematology negative hematology ROS (+)   Anesthesia Other Findings   Reproductive/Obstetrics                            Lab Results  Component Value Date   WBC 10.0 06/15/2019   HGB 12.7 06/15/2019   HCT 40.6 06/15/2019   MCV 78.4 (L) 06/15/2019   PLT 398 06/15/2019   Lab Results  Component Value Date   CREATININE 0.51 06/15/2019   BUN 13 06/15/2019   NA 134 (L) 06/15/2019   K 4.4 06/15/2019   CL 101 06/15/2019   CO2 21 (L) 06/15/2019    Anesthesia Physical Anesthesia Plan  ASA: II  Anesthesia Plan: General   Post-op Pain Management:    Induction: Intravenous  PONV Risk Score and Plan: 2 and Dexamethasone, Ondansetron and Treatment may vary due to age or medical condition  Airway Management Planned: Oral ETT  Additional Equipment:   Intra-op Plan:   Post-operative Plan: Extubation in OR  Informed Consent:   Plan Discussed with:   Anesthesia Plan Comments: (  )      Anesthesia Quick Evaluation

## 2019-06-17 ENCOUNTER — Encounter (HOSPITAL_COMMUNITY): Payer: Self-pay | Admitting: Orthopedic Surgery

## 2019-06-17 ENCOUNTER — Ambulatory Visit (HOSPITAL_COMMUNITY)

## 2019-06-17 ENCOUNTER — Ambulatory Visit (HOSPITAL_COMMUNITY): Admitting: Vascular Surgery

## 2019-06-17 ENCOUNTER — Observation Stay (HOSPITAL_COMMUNITY)
Admission: RE | Admit: 2019-06-17 | Discharge: 2019-06-18 | Disposition: A | Discharge status: Home / Self Care | Source: Ambulatory Visit | Attending: Orthopedic Surgery | Admitting: Orthopedic Surgery

## 2019-06-17 ENCOUNTER — Ambulatory Visit (HOSPITAL_COMMUNITY): Admitting: Anesthesiology

## 2019-06-17 ENCOUNTER — Other Ambulatory Visit: Payer: Self-pay

## 2019-06-17 ENCOUNTER — Encounter (HOSPITAL_COMMUNITY)
Admission: RE | Disposition: A | Discharge status: Home / Self Care | Payer: Self-pay | Source: Ambulatory Visit | Attending: Orthopedic Surgery

## 2019-06-17 DIAGNOSIS — F909 Attention-deficit hyperactivity disorder, unspecified type: Secondary | ICD-10-CM | POA: Diagnosis not present

## 2019-06-17 DIAGNOSIS — S13161A Dislocation of C5/C6 cervical vertebrae, initial encounter: Secondary | ICD-10-CM | POA: Insufficient documentation

## 2019-06-17 DIAGNOSIS — E119 Type 2 diabetes mellitus without complications: Secondary | ICD-10-CM | POA: Insufficient documentation

## 2019-06-17 DIAGNOSIS — G9589 Other specified diseases of spinal cord: Secondary | ICD-10-CM | POA: Diagnosis not present

## 2019-06-17 DIAGNOSIS — X58XXXA Exposure to other specified factors, initial encounter: Secondary | ICD-10-CM | POA: Diagnosis not present

## 2019-06-17 DIAGNOSIS — Y990 Civilian activity done for income or pay: Secondary | ICD-10-CM | POA: Insufficient documentation

## 2019-06-17 DIAGNOSIS — M4712 Other spondylosis with myelopathy, cervical region: Secondary | ICD-10-CM | POA: Diagnosis not present

## 2019-06-17 DIAGNOSIS — F17200 Nicotine dependence, unspecified, uncomplicated: Secondary | ICD-10-CM | POA: Diagnosis not present

## 2019-06-17 DIAGNOSIS — F419 Anxiety disorder, unspecified: Secondary | ICD-10-CM | POA: Insufficient documentation

## 2019-06-17 DIAGNOSIS — M1990 Unspecified osteoarthritis, unspecified site: Secondary | ICD-10-CM | POA: Diagnosis not present

## 2019-06-17 DIAGNOSIS — Z794 Long term (current) use of insulin: Secondary | ICD-10-CM | POA: Insufficient documentation

## 2019-06-17 DIAGNOSIS — Z888 Allergy status to other drugs, medicaments and biological substances status: Secondary | ICD-10-CM | POA: Insufficient documentation

## 2019-06-17 DIAGNOSIS — G959 Disease of spinal cord, unspecified: Secondary | ICD-10-CM | POA: Diagnosis present

## 2019-06-17 DIAGNOSIS — Z79899 Other long term (current) drug therapy: Secondary | ICD-10-CM | POA: Diagnosis not present

## 2019-06-17 DIAGNOSIS — Z419 Encounter for procedure for purposes other than remedying health state, unspecified: Secondary | ICD-10-CM

## 2019-06-17 DIAGNOSIS — S13171A Dislocation of C6/C7 cervical vertebrae, initial encounter: Secondary | ICD-10-CM | POA: Insufficient documentation

## 2019-06-17 DIAGNOSIS — I10 Essential (primary) hypertension: Secondary | ICD-10-CM | POA: Diagnosis not present

## 2019-06-17 HISTORY — DX: Disease of spinal cord, unspecified: G95.9

## 2019-06-17 HISTORY — PX: ANTERIOR CERVICAL DECOMP/DISCECTOMY FUSION: SHX1161

## 2019-06-17 LAB — GLUCOSE, CAPILLARY
Glucose-Capillary: 182 mg/dL — ABNORMAL HIGH (ref 70–99)
Glucose-Capillary: 236 mg/dL — ABNORMAL HIGH (ref 70–99)
Glucose-Capillary: 238 mg/dL — ABNORMAL HIGH (ref 70–99)
Glucose-Capillary: 273 mg/dL — ABNORMAL HIGH (ref 70–99)

## 2019-06-17 LAB — POCT PREGNANCY, URINE: Preg Test, Ur: NEGATIVE

## 2019-06-17 SURGERY — ANTERIOR CERVICAL DECOMPRESSION/DISCECTOMY FUSION 2 LEVELS
Anesthesia: General | Site: Neck

## 2019-06-17 MED ORDER — TRANEXAMIC ACID-NACL 1000-0.7 MG/100ML-% IV SOLN
INTRAVENOUS | Status: AC
  Filled 2019-06-17: qty 100

## 2019-06-17 MED ORDER — 0.9 % SODIUM CHLORIDE (POUR BTL) OPTIME
TOPICAL | Status: DC | PRN
  Administered 2019-06-17 (×2): 1000 mL

## 2019-06-17 MED ORDER — EPHEDRINE 5 MG/ML INJ
INTRAVENOUS | Status: AC
  Filled 2019-06-17: qty 10

## 2019-06-17 MED ORDER — LIDOCAINE 2% (20 MG/ML) 5 ML SYRINGE
INTRAMUSCULAR | Status: DC | PRN
  Administered 2019-06-17: 40 mg via INTRAVENOUS
  Administered 2019-06-17: 60 mg via INTRAVENOUS

## 2019-06-17 MED ORDER — EPINEPHRINE PF 1 MG/ML IJ SOLN
INTRAMUSCULAR | Status: DC | PRN
  Administered 2019-06-17: .15 mL

## 2019-06-17 MED ORDER — MIDAZOLAM HCL 2 MG/2ML IJ SOLN
INTRAMUSCULAR | Status: AC
  Filled 2019-06-17: qty 2

## 2019-06-17 MED ORDER — PROPOFOL 500 MG/50ML IV EMUL
INTRAVENOUS | Status: DC | PRN
  Administered 2019-06-17: 35 ug/kg/min via INTRAVENOUS

## 2019-06-17 MED ORDER — PROPOFOL 10 MG/ML IV BOLUS
INTRAVENOUS | Status: DC | PRN
  Administered 2019-06-17: 30 mg via INTRAVENOUS
  Administered 2019-06-17: 50 mg via INTRAVENOUS
  Administered 2019-06-17: 20 mg via INTRAVENOUS
  Administered 2019-06-17: 100 mg via INTRAVENOUS
  Administered 2019-06-17: 30 mg via INTRAVENOUS

## 2019-06-17 MED ORDER — METHOCARBAMOL 500 MG PO TABS
500.0000 mg | ORAL_TABLET | Freq: Four times a day (QID) | ORAL | Status: DC | PRN
  Administered 2019-06-17 – 2019-06-18 (×3): 500 mg via ORAL
  Filled 2019-06-17 (×2): qty 1

## 2019-06-17 MED ORDER — FENTANYL CITRATE (PF) 100 MCG/2ML IJ SOLN
INTRAMUSCULAR | Status: DC | PRN
  Administered 2019-06-17: 50 ug via INTRAVENOUS
  Administered 2019-06-17: 100 ug via INTRAVENOUS
  Administered 2019-06-17 (×2): 50 ug via INTRAVENOUS

## 2019-06-17 MED ORDER — LORAZEPAM 2 MG/ML IJ SOLN
INTRAMUSCULAR | Status: AC
  Filled 2019-06-17: qty 1

## 2019-06-17 MED ORDER — LACTATED RINGERS IV SOLN: INTRAVENOUS | Status: DC | PRN

## 2019-06-17 MED ORDER — THROMBIN 20000 UNITS EX SOLR
CUTANEOUS | Status: AC
  Filled 2019-06-17: qty 20000

## 2019-06-17 MED ORDER — POLYETHYLENE GLYCOL 3350 17 G PO PACK: 17.0000 g | PACK | Freq: Every day | ORAL | Status: DC | PRN

## 2019-06-17 MED ORDER — DOCUSATE SODIUM 100 MG PO CAPS
100.0000 mg | ORAL_CAPSULE | Freq: Two times a day (BID) | ORAL | Status: DC
  Administered 2019-06-17 (×2): 100 mg via ORAL
  Filled 2019-06-17 (×2): qty 1

## 2019-06-17 MED ORDER — FENTANYL CITRATE (PF) 250 MCG/5ML IJ SOLN
INTRAMUSCULAR | Status: AC
  Filled 2019-06-17: qty 5

## 2019-06-17 MED ORDER — METOPROLOL TARTRATE 25 MG PO TABS
100.0000 mg | ORAL_TABLET | Freq: Two times a day (BID) | ORAL | Status: DC
  Administered 2019-06-17: 100 mg via ORAL
  Filled 2019-06-17: qty 4

## 2019-06-17 MED ORDER — PROPOFOL 10 MG/ML IV BOLUS
INTRAVENOUS | Status: AC
  Filled 2019-06-17: qty 40

## 2019-06-17 MED ORDER — GLYCOPYRROLATE PF 0.2 MG/ML IJ SOSY
PREFILLED_SYRINGE | INTRAMUSCULAR | Status: DC | PRN
  Administered 2019-06-17: .1 mg via INTRAVENOUS

## 2019-06-17 MED ORDER — CEFAZOLIN SODIUM-DEXTROSE 1-4 GM/50ML-% IV SOLN
1.0000 g | Freq: Three times a day (TID) | INTRAVENOUS | Status: AC
  Administered 2019-06-17 (×2): 1 g via INTRAVENOUS
  Filled 2019-06-17 (×2): qty 50

## 2019-06-17 MED ORDER — EPINEPHRINE PF 1 MG/ML IJ SOLN
INTRAMUSCULAR | Status: AC
  Filled 2019-06-17: qty 1

## 2019-06-17 MED ORDER — ACETAMINOPHEN 650 MG RE SUPP: 650.0000 mg | RECTAL | Status: DC | PRN

## 2019-06-17 MED ORDER — EPHEDRINE SULFATE 50 MG/ML IJ SOLN
INTRAMUSCULAR | Status: DC | PRN
  Administered 2019-06-17: 25 mg via INTRAVENOUS

## 2019-06-17 MED ORDER — PHENYLEPHRINE HCL-NACL 10-0.9 MG/250ML-% IV SOLN
INTRAVENOUS | Status: DC | PRN
  Administered 2019-06-17: 25 ug/min via INTRAVENOUS

## 2019-06-17 MED ORDER — BUPIVACAINE HCL (PF) 0.25 % IJ SOLN
INTRAMUSCULAR | Status: AC
  Filled 2019-06-17: qty 30

## 2019-06-17 MED ORDER — SUCCINYLCHOLINE CHLORIDE 200 MG/10ML IV SOSY
PREFILLED_SYRINGE | INTRAVENOUS | Status: AC
  Filled 2019-06-17: qty 10

## 2019-06-17 MED ORDER — SUCCINYLCHOLINE CHLORIDE 20 MG/ML IJ SOLN
INTRAMUSCULAR | Status: DC | PRN
  Administered 2019-06-17: 100 mg via INTRAVENOUS

## 2019-06-17 MED ORDER — FENTANYL CITRATE (PF) 100 MCG/2ML IJ SOLN
INTRAMUSCULAR | Status: AC
  Filled 2019-06-17: qty 2

## 2019-06-17 MED ORDER — LISINOPRIL 10 MG PO TABS
10.0000 mg | ORAL_TABLET | Freq: Two times a day (BID) | ORAL | Status: DC
  Filled 2019-06-17 (×2): qty 1

## 2019-06-17 MED ORDER — SODIUM CHLORIDE 0.9% FLUSH: 3.0000 mL | INTRAVENOUS | Status: DC | PRN

## 2019-06-17 MED ORDER — LACTATED RINGERS IV SOLN: INTRAVENOUS | Status: DC

## 2019-06-17 MED ORDER — HEMOSTATIC AGENTS (NO CHARGE) OPTIME: TOPICAL | Status: DC | PRN

## 2019-06-17 MED ORDER — ALBUTEROL SULFATE HFA 108 (90 BASE) MCG/ACT IN AERS
INHALATION_SPRAY | RESPIRATORY_TRACT | Status: DC | PRN
  Administered 2019-06-17: 4 via RESPIRATORY_TRACT

## 2019-06-17 MED ORDER — CEFAZOLIN SODIUM-DEXTROSE 2-4 GM/100ML-% IV SOLN
2.0000 g | INTRAVENOUS | Status: AC
  Administered 2019-06-17: 2 g via INTRAVENOUS
  Filled 2019-06-17: qty 100

## 2019-06-17 MED ORDER — ONDANSETRON HCL 4 MG/2ML IJ SOLN
INTRAMUSCULAR | Status: DC | PRN
  Administered 2019-06-17: 4 mg via INTRAVENOUS

## 2019-06-17 MED ORDER — PROMETHAZINE HCL 25 MG/ML IJ SOLN: 6.2500 mg | INTRAMUSCULAR | Status: DC | PRN

## 2019-06-17 MED ORDER — PHENYLEPHRINE 40 MCG/ML (10ML) SYRINGE FOR IV PUSH (FOR BLOOD PRESSURE SUPPORT)
PREFILLED_SYRINGE | INTRAVENOUS | Status: AC
  Filled 2019-06-17: qty 20

## 2019-06-17 MED ORDER — INSULIN ASPART 100 UNIT/ML ~~LOC~~ SOLN
0.0000 [IU] | Freq: Three times a day (TID) | SUBCUTANEOUS | Status: DC
  Administered 2019-06-17 (×2): 5 [IU] via SUBCUTANEOUS
  Administered 2019-06-18: 3 [IU] via SUBCUTANEOUS

## 2019-06-17 MED ORDER — INSULIN ASPART 100 UNIT/ML ~~LOC~~ SOLN
0.0000 [IU] | Freq: Every day | SUBCUTANEOUS | Status: DC
  Administered 2019-06-17: 23:00:00 3 [IU] via SUBCUTANEOUS

## 2019-06-17 MED ORDER — OXYCODONE HCL 5 MG PO TABS: 5.0000 mg | ORAL_TABLET | ORAL | Status: DC | PRN

## 2019-06-17 MED ORDER — ONDANSETRON HCL 4 MG PO TABS: 4.0000 mg | ORAL_TABLET | Freq: Four times a day (QID) | ORAL | Status: DC | PRN

## 2019-06-17 MED ORDER — BUPIVACAINE HCL (PF) 0.25 % IJ SOLN
INTRAMUSCULAR | Status: DC | PRN
  Administered 2019-06-17: 30 mL

## 2019-06-17 MED ORDER — ACETAMINOPHEN 325 MG PO TABS: 650.0000 mg | ORAL_TABLET | ORAL | Status: DC | PRN

## 2019-06-17 MED ORDER — PHENYLEPHRINE HCL (PRESSORS) 10 MG/ML IV SOLN
INTRAVENOUS | Status: DC | PRN
  Administered 2019-06-17 (×2): 80 ug via INTRAVENOUS

## 2019-06-17 MED ORDER — MENTHOL 3 MG MT LOZG: 1.0000 | LOZENGE | OROMUCOSAL | Status: DC | PRN

## 2019-06-17 MED ORDER — METHOCARBAMOL 500 MG PO TABS: 500.0000 mg | ORAL_TABLET | Freq: Three times a day (TID) | ORAL | 0 refills | Status: AC | PRN

## 2019-06-17 MED ORDER — TRANEXAMIC ACID 1000 MG/10ML IV SOLN
INTRAVENOUS | Status: DC | PRN
  Administered 2019-06-17: 1000 mg via INTRAVENOUS

## 2019-06-17 MED ORDER — MIDAZOLAM HCL 5 MG/5ML IJ SOLN
INTRAMUSCULAR | Status: DC | PRN
  Administered 2019-06-17: 1 mg via INTRAVENOUS

## 2019-06-17 MED ORDER — LIDOCAINE 2% (20 MG/ML) 5 ML SYRINGE
INTRAMUSCULAR | Status: AC
  Filled 2019-06-17: qty 5

## 2019-06-17 MED ORDER — MORPHINE SULFATE (PF) 2 MG/ML IV SOLN
2.0000 mg | INTRAVENOUS | Status: DC | PRN
  Administered 2019-06-17: 2 mg via INTRAVENOUS
  Filled 2019-06-17: qty 1

## 2019-06-17 MED ORDER — HYDROCODONE-ACETAMINOPHEN 5-325 MG PO TABS
1.0000 | ORAL_TABLET | ORAL | Status: DC | PRN
  Administered 2019-06-17 – 2019-06-18 (×4): 2 via ORAL
  Filled 2019-06-17 (×4): qty 2

## 2019-06-17 MED ORDER — METHOCARBAMOL 1000 MG/10ML IJ SOLN
500.0000 mg | Freq: Four times a day (QID) | INTRAVENOUS | Status: DC | PRN
  Filled 2019-06-17: qty 5

## 2019-06-17 MED ORDER — LIRAGLUTIDE 18 MG/3ML ~~LOC~~ SOPN: 1.8000 mg | PEN_INJECTOR | Freq: Every day | SUBCUTANEOUS | Status: DC

## 2019-06-17 MED ORDER — FENTANYL CITRATE (PF) 100 MCG/2ML IJ SOLN
25.0000 ug | INTRAMUSCULAR | Status: DC | PRN
  Administered 2019-06-17: 50 ug via INTRAVENOUS

## 2019-06-17 MED ORDER — ALPRAZOLAM 0.5 MG PO TABS
0.5000 mg | ORAL_TABLET | Freq: Three times a day (TID) | ORAL | Status: DC | PRN
  Administered 2019-06-17: 0.5 mg via ORAL
  Filled 2019-06-17: qty 1

## 2019-06-17 MED ORDER — ONDANSETRON HCL 4 MG PO TABS: 4.0000 mg | ORAL_TABLET | Freq: Three times a day (TID) | ORAL | 0 refills | Status: DC | PRN

## 2019-06-17 MED ORDER — OXYCODONE HCL 5 MG PO TABS: 10.0000 mg | ORAL_TABLET | ORAL | Status: DC | PRN

## 2019-06-17 MED ORDER — THROMBIN 20000 UNITS EX SOLR
CUTANEOUS | Status: DC | PRN
  Administered 2019-06-17: 20 mL via TOPICAL

## 2019-06-17 MED ORDER — SODIUM CHLORIDE 0.9% FLUSH
3.0000 mL | Freq: Two times a day (BID) | INTRAVENOUS | Status: DC
  Administered 2019-06-17: 3 mL via INTRAVENOUS

## 2019-06-17 MED ORDER — DEXAMETHASONE SODIUM PHOSPHATE 10 MG/ML IJ SOLN
INTRAMUSCULAR | Status: AC
  Filled 2019-06-17: qty 1

## 2019-06-17 MED ORDER — FLEET ENEMA 7-19 GM/118ML RE ENEM: 1.0000 | ENEMA | Freq: Once | RECTAL | Status: DC | PRN

## 2019-06-17 MED ORDER — LORAZEPAM 2 MG/ML IJ SOLN: 1.0000 mg | Freq: Once | INTRAMUSCULAR | Status: DC

## 2019-06-17 MED ORDER — OXYCODONE-ACETAMINOPHEN 10-325 MG PO TABS: 1.0000 | ORAL_TABLET | Freq: Four times a day (QID) | ORAL | 0 refills | Status: AC | PRN

## 2019-06-17 MED ORDER — DIPHENHYDRAMINE HCL 25 MG PO CAPS
25.0000 mg | ORAL_CAPSULE | Freq: Four times a day (QID) | ORAL | Status: DC | PRN
  Administered 2019-06-17: 25 mg via ORAL
  Filled 2019-06-17: qty 1

## 2019-06-17 MED ORDER — PHENOL 1.4 % MT LIQD
1.0000 | OROMUCOSAL | Status: DC | PRN
  Filled 2019-06-17: qty 177

## 2019-06-17 MED ORDER — ONDANSETRON HCL 4 MG/2ML IJ SOLN: 4.0000 mg | Freq: Four times a day (QID) | INTRAMUSCULAR | Status: DC | PRN

## 2019-06-17 MED ORDER — METHOCARBAMOL 500 MG PO TABS
ORAL_TABLET | ORAL | Status: AC
  Filled 2019-06-17: qty 1

## 2019-06-17 MED ORDER — GLYCOPYRROLATE PF 0.2 MG/ML IJ SOSY
PREFILLED_SYRINGE | INTRAMUSCULAR | Status: AC
  Filled 2019-06-17: qty 2

## 2019-06-17 MED ORDER — ONDANSETRON HCL 4 MG/2ML IJ SOLN
INTRAMUSCULAR | Status: AC
  Filled 2019-06-17: qty 2

## 2019-06-17 MED ORDER — DEXAMETHASONE SODIUM PHOSPHATE 10 MG/ML IJ SOLN
INTRAMUSCULAR | Status: DC | PRN
  Administered 2019-06-17: 10 mg via INTRAVENOUS

## 2019-06-17 MED ORDER — ACETAMINOPHEN 500 MG PO TABS
1000.0000 mg | ORAL_TABLET | Freq: Once | ORAL | Status: AC
  Administered 2019-06-17: 1000 mg via ORAL
  Filled 2019-06-17: qty 2

## 2019-06-17 SURGICAL SUPPLY — 83 items
AGENT HMST KT MTR STRL THRMB (HEMOSTASIS)
BIT DRILL NEURO 2X3.1 SFT TUCH (MISCELLANEOUS) IMPLANT
BLADE CLIPPER SURG (BLADE) IMPLANT
BONE VIVIGEN FORMABLE 1.3CC (Bone Implant) ×3 IMPLANT
BUR EGG ELITE 4.0 (BURR) IMPLANT
BUR EGG ELITE 4.0MM (BURR)
BUR MATCHSTICK NEURO 3.0 LAGG (BURR) IMPLANT
CABLE BIPOLOR RESECTION CORD (MISCELLANEOUS) ×3 IMPLANT
CANISTER SUCT 3000ML PPV (MISCELLANEOUS) ×3 IMPLANT
CLOSURE STERI-STRIP 1/2X4 (GAUZE/BANDAGES/DRESSINGS) ×1
CLSR STERI-STRIP ANTIMIC 1/2X4 (GAUZE/BANDAGES/DRESSINGS) ×2 IMPLANT
COVER MAYO STAND STRL (DRAPES) ×9 IMPLANT
COVER SURGICAL LIGHT HANDLE (MISCELLANEOUS) ×4 IMPLANT
COVER WAND RF STERILE (DRAPES) ×1 IMPLANT
DEVICE ENDSKLTN IMPLANT SM 7MM (Cage) IMPLANT
DRAPE C-ARM 42X72 X-RAY (DRAPES) ×3 IMPLANT
DRAPE MICROSCOPE LEICA 46X105 (MISCELLANEOUS) IMPLANT
DRAPE POUCH INSTRU U-SHP 10X18 (DRAPES) ×3 IMPLANT
DRAPE SURG 17X23 STRL (DRAPES) ×3 IMPLANT
DRAPE U-SHAPE 47X51 STRL (DRAPES) ×3 IMPLANT
DRILL NEURO 2X3.1 SOFT TOUCH (MISCELLANEOUS)
DRSG OPSITE POSTOP 3X4 (GAUZE/BANDAGES/DRESSINGS) ×1 IMPLANT
DRSG OPSITE POSTOP 4X6 (GAUZE/BANDAGES/DRESSINGS) ×2 IMPLANT
DURAPREP 26ML APPLICATOR (WOUND CARE) ×3 IMPLANT
ELECT COATED BLADE 2.86 ST (ELECTRODE) ×3 IMPLANT
ELECT PENCIL ROCKER SW 15FT (MISCELLANEOUS) ×3 IMPLANT
ELECT REM PT RETURN 9FT ADLT (ELECTROSURGICAL) ×3
ELECTRODE REM PT RTRN 9FT ADLT (ELECTROSURGICAL) ×1 IMPLANT
ENDOSKELETON IMPLANT SM 7MM (Cage) ×6 IMPLANT
FEE INTRAOP MONITOR IMPULS NCS (MISCELLANEOUS) IMPLANT
FILTER STRAW FLUID ASPIR (MISCELLANEOUS) ×2 IMPLANT
GLOVE BIO SURGEON STRL SZ 6.5 (GLOVE) ×1 IMPLANT
GLOVE BIO SURGEONS STRL SZ 6.5 (GLOVE)
GLOVE BIOGEL PI IND STRL 6.5 (GLOVE) ×1 IMPLANT
GLOVE BIOGEL PI IND STRL 8.5 (GLOVE) ×1 IMPLANT
GLOVE BIOGEL PI INDICATOR 6.5 (GLOVE) ×2
GLOVE BIOGEL PI INDICATOR 8.5 (GLOVE) ×2
GLOVE SS BIOGEL STRL SZ 8.5 (GLOVE) ×1 IMPLANT
GLOVE SUPERSENSE BIOGEL SZ 8.5 (GLOVE)
GOWN STRL REUS W/ TWL LRG LVL3 (GOWN DISPOSABLE) ×1 IMPLANT
GOWN STRL REUS W/TWL 2XL LVL3 (GOWN DISPOSABLE) ×3 IMPLANT
GOWN STRL REUS W/TWL LRG LVL3 (GOWN DISPOSABLE) ×9
GRAFT BNE MATRIX VG FRMBL SM 1 (Bone Implant) IMPLANT
INTRAOP MONITOR FEE IMPULS NCS (MISCELLANEOUS) ×1
INTRAOP MONITOR FEE IMPULSE (MISCELLANEOUS) ×3
KIT BASIN OR (CUSTOM PROCEDURE TRAY) ×3 IMPLANT
KIT TURNOVER KIT B (KITS) ×3 IMPLANT
NDL SPNL 18GX3.5 QUINCKE PK (NEEDLE) ×1 IMPLANT
NEEDLE HYPO 22GX1.5 SAFETY (NEEDLE) ×3 IMPLANT
NEEDLE SPNL 18GX3.5 QUINCKE PK (NEEDLE) ×3 IMPLANT
NS IRRIG 1000ML POUR BTL (IV SOLUTION) ×7 IMPLANT
PACK ORTHO CERVICAL (CUSTOM PROCEDURE TRAY) ×3 IMPLANT
PACK UNIVERSAL I (CUSTOM PROCEDURE TRAY) ×3 IMPLANT
PAD ARMBOARD 7.5X6 YLW CONV (MISCELLANEOUS) ×2 IMPLANT
PATTIES SURGICAL .25X.25 (GAUZE/BANDAGES/DRESSINGS) ×1 IMPLANT
PATTIES SURGICAL .5 X.5 (GAUZE/BANDAGES/DRESSINGS) ×2 IMPLANT
PIN DISTRACTION 14 (PIN) ×4 IMPLANT
PLATE TWO LEVEL SKYLINE 30MM (Plate) ×2 IMPLANT
POSITIONER HEAD DONUT 9IN (MISCELLANEOUS) ×3 IMPLANT
PUTTY DBX 1CC (Putty) ×3 IMPLANT
PUTTY DBX 1CC DEPUY (Putty) IMPLANT
RESTRAINT LIMB HOLDER UNIV (RESTRAINTS) ×3 IMPLANT
RUBBERBAND STERILE (MISCELLANEOUS) IMPLANT
SCREW SELF DRILL SKYLINE 12MM (Screw) ×4 IMPLANT
SCREW SKYLINE 14MM SD-VA (Screw) ×8 IMPLANT
SPONGE INTESTINAL PEANUT (DISPOSABLE) ×5 IMPLANT
SPONGE LAP 4X18 RFD (DISPOSABLE) ×6 IMPLANT
SPONGE SURGIFOAM ABS GEL 100 (HEMOSTASIS) ×3 IMPLANT
SURGIFLO W/THROMBIN 8M KIT (HEMOSTASIS) IMPLANT
SUT BONE WAX W31G (SUTURE) ×5 IMPLANT
SUT MON AB 3-0 SH 27 (SUTURE) ×3
SUT MON AB 3-0 SH27 (SUTURE) ×1 IMPLANT
SUT SILK 2 0 (SUTURE)
SUT SILK 2-0 18XBRD TIE 12 (SUTURE) IMPLANT
SUT VIC AB 2-0 CT1 18 (SUTURE) ×3 IMPLANT
SYR BULB IRRIGATION 50ML (SYRINGE) ×3 IMPLANT
SYR CONTROL 10ML LL (SYRINGE) ×3 IMPLANT
SYR TB 1ML LUER SLIP (SYRINGE) ×2 IMPLANT
TAPE CLOTH 4X10 WHT NS (GAUZE/BANDAGES/DRESSINGS) ×3 IMPLANT
TAPE UMBILICAL COTTON 1/8X30 (MISCELLANEOUS) ×3 IMPLANT
TOWEL GREEN STERILE (TOWEL DISPOSABLE) ×3 IMPLANT
TOWEL GREEN STERILE FF (TOWEL DISPOSABLE) ×3 IMPLANT
WATER STERILE IRR 1000ML POUR (IV SOLUTION) ×3 IMPLANT

## 2019-06-17 NOTE — Anesthesia Procedure Notes (Addendum)
Procedure Name: Intubation Date/Time: 06/17/2019 7:51 AM Performed by: Fransisca Kaufmann, CRNA Pre-anesthesia Checklist: Patient identified, Emergency Drugs available, Suction available and Patient being monitored Patient Re-evaluated:Patient Re-evaluated prior to induction Oxygen Delivery Method: Circle System Utilized Preoxygenation: Pre-oxygenation with 100% oxygen Induction Type: IV induction Ventilation: Mask ventilation without difficulty Laryngoscope Size: Glidescope and 4 Grade View: Grade I Tube type: Oral Number of attempts: 1 Airway Equipment and Method: Stylet and Oral airway Placement Confirmation: ETT inserted through vocal cords under direct vision,  positive ETCO2 and breath sounds checked- equal and bilateral Secured at: 22 cm Tube secured with: Tape Dental Injury: Teeth and Oropharynx as per pre-operative assessment  Difficulty Due To: Difficult Airway- due to large tongue, Difficult Airway- due to reduced neck mobility and Difficult Airway- due to limited oral opening

## 2019-06-17 NOTE — Discharge Instructions (Signed)
    Today you will be discharged from the hospital.  The purpose of the following handout is to help guide you over the next 2 weeks.  First and foremost, be sure you have a follow up appointment with Dr. Verlon Pischke 2 weeks from the time of your surgery to have your sutures removed.  Please call [facility] [phone] to schedule or confirm this appointment.      Brace You do not have to wear the collar while lying in bed or sitting in a high-backed chair, eating, sleeping or showering.  Other than these instances, you must wear the brace.  You may NOT wear the collar while driving a vehicle (see driving restrictions below).  It is advisable that you wear the collar in public places or while traveling in a car as a passenger.  Dr. Taytem Ghattas will discuss further use of the collar at your 2 week postop visit.  Wound Care You may SHOWER 5 days from the date of surgery.  Shower directly over the steri-strips.  DO NOT scrub or submerge (bath tub, swimming pool, hot tub, etc.) the area.  Pat to dry following your shower.  There is no need for additional dressings other than the steri-strips.  Allow the steri-strips to fall off on their own.  Once the strips have fallen off, you may leave the area undressed.  DO NOT apply lotion/cream/ointment to the area.  The wound must remain dry at all times other than while showering.  Dr. Sadako Cegielski or his staff will remove your stiches at your first postop visit and give you additional instructions regarding wound care at that time.   Activity NO DRIVING FOR 2 WEEKS.  No lifting over 5 pounds (approximately a gallon of milk).  No bending, stooping, squatting or twisting.  No overhead activities.  We encourage you to walk (short distances and often throughout the day) as you can tolerate.  A good rule of thumb is to get up and move once or twice every hour.  You may go up and down stairs carefully.  As you continue to recover, Dr. Keontay Vora will address and adjust  restrictions to your activities until no further restrictions are needed.  However, until your first postop visit, when Dr. Mishti Swanton can assess your recovery, you are to follow these instructions.  At the end of this document is a tentative outline of activities for up to 1 year.       Medication You will be discharged from the hospital with medication for pain, spasm, nausea and constipation.  You will be given enough medication to last until your first postop visit in 2 weeks.  Medications WILL NOT BE REFILLED EARLY; therefore, you are to take the medications only as directed.  If you have been given multiple prescriptions, please leave them with your pharmacy.  They can keep them on file for when you need them.  Medications that are lost or stolen WILL NOT be replaced.  We will address the need for continuing certain medications on an individual basis during your postop visit.  We ask that you avoid over the counter anti-inflammatory medications (Advil, Aleve, Motrin) for 3 months.    What you can expect following neck surgery... It is not uncommon to experience a sore throat or difficulty swallowing following neck surgery.  Cold liquids and soft foods are helpful in soothing this discomfort.  There is no specific diet that you are to follow after surgery, however, there are a few   things you should keep in mind to avoid unneeded discomfort.  Take small bites and eat slowly.  Chew your food thoroughly before swallowing.   It is not uncommon to experience incisional soreness or pain in the back of the neck, shoulders or between the shoulder blades.  These symptoms will slowly begin to resolve as you continue to recover, however, they can last for a few weeks.    It is not uncommon to experience INTERMITTENT arm pain following surgery.  This pain can mimic the arm pain you had prior to surgery.  As long as the pain resolves on its own and is not constant, there is no need to become alarmed.   When To  Call If you experience fever >101F, loss of bowel or bladder control, painful swelling in the lower extremities, constant (unresolving) arm pain.  If you experience any of these symptoms, please call  Orthopaedics [phone].  What's Next As mentioned earlier, you will follow up with Dr. Garrin Kirwan in 2 weeks.  At that time, we will likely remove your stitches and discuss additional aspects of your recovery.         ACTIVITY GUIDELINES ANTERIOR CERVICAL DISECTOMY AND FUSION  Activity Discharge 2 weeks 6 weeks 3 months 6 months 1 year  Shower 5 days        Submerge the wound  no no yes     Walking outdoors yes       Lifting 5 lbs yes       Climbing stairs yes       Cooking yes       Car rides (less than 30 minutes) yes       Car rides (greater than 30 minutes) no varies yes     Air travel no varies yes     Short outings (church, visits, etc...) yes       School no no yes     Driving a car no no varies yes    Light upper extremity exercises no no varies yes    Stationary bike no no yes     Swimming (no diving) no no no varies yes   Vacuuming, laundry, mopping no no no varies yes   Biking outdoors no no no no varies yes  Light jogging no no no varies yes   Low impact aerobics no no no varies yes   Non-contact sports (tennis, golf) no no no varies yes   Hunting (no tree climbing) no no no varies yes   Dancing (non-gymnastics) no no no varies yes   Down-hill skiing (experienced skier) no no no no yes   Down-hill skiing (novice) no no no no yes   Cross-country skiing no no no no yes   Horseback riding (noncompetitive)  no no no no yes   Horseback riding (competitive) no no no no varies yes  Gardening/landscaping no no no varies yes   House repairs no no no varies varies yes  Lifting up to 50 lbs no no no no varies yes   

## 2019-06-17 NOTE — H&P (Signed)
Addendum H&P: Patient continues to have significant radicular and myelopathic findings.  She notes increasing difficulty maintaining her balance.  She has had appropriate preoperative medical clearance both from her primary care physician and most recently her cardiologist.  We have again gone over the surgical procedure in great detail and all of her questions were encouraged and addressed.  Plan on moving forward with a two-level ACDF to address the disc herniation causing the myelopathy.  There is been no change in her clinical exam since her last office visit of 06/11/2019.

## 2019-06-17 NOTE — Anesthesia Postprocedure Evaluation (Signed)
Anesthesia Post Note  Patient: Cynthia Wilkinson  Procedure(s) Performed: ANTERIOR CERVICAL DECOMPRESSION/DISCECTOMY FUSION C5-7 (N/A Neck)     Patient location during evaluation: PACU Anesthesia Type: General Level of consciousness: awake and alert Pain management: pain level controlled Vital Signs Assessment: post-procedure vital signs reviewed and stable Respiratory status: spontaneous breathing, nonlabored ventilation, respiratory function stable and patient connected to nasal cannula oxygen Cardiovascular status: blood pressure returned to baseline and stable Postop Assessment: no apparent nausea or vomiting Anesthetic complications: no    Last Vitals:  Vitals:   06/17/19 1315 06/17/19 1326  BP: 118/76 114/77  Pulse: 91 96  Resp: 12 18  Temp:    SpO2: 99% 95%    Last Pain:  Vitals:   06/17/19 1315  TempSrc:   PainSc: [name]

## 2019-06-17 NOTE — Brief Op Note (Signed)
06/17/2019  12:33 PM  PATIENT:  Cynthia Wilkinson  49 y.o. female  PRE-OPERATIVE DIAGNOSIS:  Cervical myeloradiculopathy  POST-OPERATIVE DIAGNOSIS:  Cervical myeloradiculopathy  PROCEDURE:  Procedure(s) with comments: ANTERIOR CERVICAL DECOMPRESSION/DISCECTOMY FUSION C5-7 (N/A) - 3 hrs  SURGEON:  Surgeon(s) and Role:    Venita Lick, MD - Primary  PHYSICIAN ASSISTANT:   ASSISTANTS: Amanda Ward, PA   ANESTHESIA:   general  EBL:  minimal   BLOOD ADMINISTERED:none  DRAINS: none   LOCAL MEDICATIONS USED:  MARCAINE    and OTHER TXA  SPECIMEN:  No Specimen  DISPOSITION OF SPECIMEN:  N/A  COUNTS:  YES  TOURNIQUET:  * No tourniquets in log *  DICTATION: .Dragon Dictation  PLAN OF CARE: Admit for overnight observation  PATIENT DISPOSITION:  PACU - hemodynamically stable.

## 2019-06-17 NOTE — Op Note (Signed)
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  Right radicular arm pain and weakness.  Positive myelopathic findings.  Postoperative diagnosis: Same  Operative procedure: Anterior cervical discectomy and fusion C5-7  First Assistant: Cleta Alberts, PA  Neuro monitoring: Improvement in SSEP and evoked motor potential monitoring from baseline at the conclusion of the case.  Implants:  Medtronic/Titan intervertebral cage.  7 mm small lordotic at both levels.   DePuy skyline anterior cervical plate 30 mm in length.  Affixed with 14 mm length self drilling screws at C5   and C7, and 12 mm screws at C6.  Allograft: DBX, and vivogen  Indications: Anelia is a very pleasant 49 year old woman who presents after work-related injury resulting in cervical disc herniation in both spinal cord compression and right C6 and C7 nerve root compression.  At the time of her evaluation I noted she had myelomalacia on MRI and positive balance defects but she was still ambulatory.  Patient had obvious myelopathic findings so we elected to move forward with surgery.  All appropriate risks benefits and alternatives were discussed with the patient and her husband.  Goal of surgery was also discussed which was to prevent worsening of the symptoms not necessarily improvement of her current condition.  Operative report  Patient was brought the operating room placed upon the operating room table.  After successful induction of general anesthesia and endotracheal ovation teds SCDs were applied and the neuro monitoring representative applied all appropriate needles and pads for intraoperative SSEP and evoked motor potential monitoring.  The anterior cervical spine was then prepped and draped in a standard fashion.  A timeout was taken to confirm patient procedure and all other important data.  Lateral fluoroscopy was then brought sterilely into the field and identified the C5-6 disc space.   I then mapped out the incision site just below that level and infiltrated with quarter percent Marcaine with epinephrine.  Transverse incision was made starting in the midline and proceeding out to the left and I sharply dissected down to the platysma.  The platysma was isolated and transected in a standard Smith-Robinson approach in the anterior cervical spine.  I sharply dissected into the deep cervical fascia staying medial to the sternocleidomastoid.  I then dissected down through the deep cervical fascia until identified the omohyoid muscle.  The omohyoid muscle was isolated and transected for improved visualization.  Once this was done I then bluntly dissected through the remainder of the prevertebral fascia and swept the trachea and esophagus off to the right.  Retractor was placed to hold this and identified and protected the carotid sheath with my finger.  A needle was placed into the C5-6 disc space and an x-ray was taken to confirm that I was at the appropriate level.  Once confirmed I proceed with the surgery.  Using bipolar cautery I mobilized the longus coli muscle from the inferior aspect of C7 to the superior aspect of C5.  Once this was done I resected the anterior bone spurs from C5-6 and C6-7 with a double-action Leksell rongeur.  I then placed the Caspar retractors into the wound and the endotracheal cuff was deflated.  I expanded the retractor to the appropriate width and so I can visualize the entire anterior aspect of the cervical spine.  A C5-6 annulotomy was then performed with a 15 blade scalpel and then I remove the bulk of the disc material with a pituitary rongeurs.  I then took down the overhanging osteophyte on the  inferior aspect of C5 with a 2 mm Kerrison rongeur.  Distraction pins were placed into the body of C5 and C6 and a expanded the intervertebral space with a lamina spreader and maintained the distraction with the pin set.  I continued with my curettes removing all of the  disc material.  As a guide to the posterior margin I used my 1 mm Kerrison rongeur to take down the posterior osteophytes of C5 and C6.  I then swept circumferentially with a nerve hook and delivered 2 large fragments of disc material.  I then was able to gently continue to dissect with a nerve hook until I was able to get behind the posterior longitudinal ligament and I resected it.  There was a portion of the PLL that was calcified and quite adherent.  I did ultimately be able to dissect through but it was quite difficult.  Once I completed the decompression I undercut the uncovertebral joints to improve the overall foraminal volume.  At this point I was able to pass my nerve hook behind the body of C6 and behind the body of C5 and under the uncovertebral joints confirming satisfactory decompression.  I rasped the endplates and then used the trial implant and elected use the size 7 small intervertebral cage.  This was packed with the allograft and malleted to the appropriate depth.  It had excellent overall purchase and was countersunk approximately 2 mm from the endplate.  I then repositioned the retractors to expose the C6-7 disc space level and using the same technique I exposed and placed the Caspar retractors.  Annulotomy was performed and the bulk of the disc material was removed with pituitary rondure the overhanging osteophyte from the inferior aspect of C6 was taken down with a 2 mm Kerrison rongeur and then I used my curettes to remove the bulk of the disc material again using a nerve hook I was able to deliver couple fragments of disc material and then dissect underneath the posterior longitudinal ligament.  I resected this with a 1 mm Kerrison rongeur.  I again undercut underneath the uncovertebral joints and I made sure I could freely sweep my nerve hook superiorly and inferiorly underneath the vertebral body.  At this point I was pleased with the overall discectomy/decompression.  I rasped the  endplates and elected to use the size 7 mm small lordotic cage.  The implant was packed with the allograft and malleted to the appropriate depth.  Caspar pins were then removed and the resulting bleeding bone holes were sealed with bone wax.  I irrigated the wound copiously with normal saline and made sure hemostasis.  The remaining portion of DBX was used to place just over the anterior aspect of the cages.  Anterior cervical plate was contoured so I placed some's lordosis at the C6-7 level.  I then applied the plate and secured it with 14 mm locking screws into the body of C5 and 14 mm locking screws into the body of C7.  All 4 locking screws had excellent purchase.  The final 2 locking screws were placed into the body of C6 these were 12 mm length screws.  All 6 screws were tightened down according manufacture standards and locked to the plate according to manufacture standards.  The retractor was removed and I confirmed that the trach and esophagus was not inadvertently caught underneath the plate.  After final irrigation I confirmed hemostasis and then returned the trachea and esophagus to midline.  The platysma was  closed with erupted 2-0 Vicryl suture stitches and the skin was closed with 3-0 Monocryl.  Steri-Strips dry dressing and an Aspen collar were applied.  At the end of the case all needle and sponge counts were correct.  Evoked motor and SSEP monitoring had improved during the course of the case.  The patient was ultimately extubated transferred to the PACU without incident.

## 2019-06-17 NOTE — Transfer of Care (Signed)
Immediate Anesthesia Transfer of Care Note  Patient: Cynthia Wilkinson  Procedure(s) Performed: ANTERIOR CERVICAL DECOMPRESSION/DISCECTOMY FUSION C5-7 (N/A Neck)  Patient Location: PACU  Anesthesia Type:General  Level of Consciousness: awake, alert , oriented and sedated  Airway & Oxygen Therapy: Patient Spontanous Breathing and Patient connected to nasal cannula oxygen  Post-op Assessment: Report given to RN, Post -op Vital signs reviewed and stable and Patient moving all extremities  Post vital signs: Reviewed and stable  Last Vitals:  Vitals Value Taken Time  BP 109/75 06/17/19 1238  Temp    Pulse 97 06/17/19 1249  Resp 13 06/17/19 1249  SpO2 98 % 06/17/19 1249  Vitals shown include unvalidated device data.  Last Pain:  Vitals:   06/17/19 0645  TempSrc:   PainSc: 8       Patients Stated Pain Goal: 2 (06/17/19 0645)  Complications: No apparent anesthesia complications

## 2019-06-18 ENCOUNTER — Encounter: Payer: Self-pay | Admitting: *Deleted

## 2019-06-18 DIAGNOSIS — M4712 Other spondylosis with myelopathy, cervical region: Secondary | ICD-10-CM | POA: Diagnosis not present

## 2019-06-18 LAB — GLUCOSE, CAPILLARY: Glucose-Capillary: 167 mg/dL — ABNORMAL HIGH (ref 70–99)

## 2019-06-18 NOTE — Progress Notes (Signed)
Patient is discharged from room 3C09 at this time. Alert and in stable condition. IV site d/c'd and instructions read to patient and spouse with understanding verbalized. Left unit via wheelchair with all belongings at side. 

## 2019-06-18 NOTE — Evaluation (Signed)
Physical Therapy Evaluation Patient Details Name: Cynthia Wilkinson MRN: [id number] DOB: 07-Aug-1970 Today's Date: 06/18/2019   History of Present Illness  Pt is 49 y/o female s/p C5-7 ACDF. PMH includes DM2, aortic stenosis.  Clinical Impression  Pt presents with good knowledge and understanding of cervical precautions after PT education, increased time and effort to mobilize but mobilizes safely with and without use of straight cane, and proficiency at stair navigation demonstrating readiness to d/c to home. Pt ambulated greater than household distance with and without cane during PT session, pt with history of toe walking which PT educated pt on it decreasing stability during gait, pt understands and attempts to correct. PT also reviewed safe sleeping position, as pt is typically a stomach sleeper which PT discouraged.  All mobility needs addressed, pt with no further questions, pt to d/c with assist of husband as needed at home.     Follow Up Recommendations No PT follow up;Supervision for mobility/OOB    Equipment Recommendations  None recommended by PT    Recommendations for Other Services       Precautions / Restrictions Precautions Precautions: Cervical Precaution Booklet Issued: Yes (comment) Precaution Comments: reinforced cervical precautions in context of bed mobility, ambulation Required Braces or Orthoses: Cervical Brace Cervical Brace: Hard collar Restrictions Weight Bearing Restrictions: No Other Position/Activity Restrictions: okay to remove when sleeping, showering      Mobility  Bed Mobility Overal bed mobility: Needs Assistance Bed Mobility: Rolling;Sidelying to Sit;Sit to Sidelying Rolling: Supervision Sidelying to sit: Supervision     Sit to sidelying: Supervision General bed mobility comments: supervision for safety, verbal cuing for proper log roll technique in and out of bed. Pt showed PT sleeping position in semi-prone/semi-sidelying - encouraged supported  sidelying instead to keep neutral cervical alignment.  Transfers Overall transfer level: Modified independent Equipment used: None             General transfer comment: mod I for increased time to rise, no physical assist.  Ambulation/Gait Ambulation/Gait assistance: Supervision Gait Distance (Feet): 250 Feet Assistive device: None;Straight cane Gait Pattern/deviations: Step-through pattern;Decreased stride length Gait velocity: decr   General Gait Details: supervision for safety. Observed gait with and without use of straight cane, improved normal BOS with use of cane but requires sequencing cues to use. Without AD, pt with widened BOS and increased toe walking which PT discouraged (but pt with toe-walking since childhood). No overt LOB or unsteadiness, but pt states "I feel like I am drunk walking".  Stairs Stairs: Yes Stairs assistance: Min guard Stair Management: With cane;Forwards;Step to pattern Number of Stairs: 4 General stair comments: min guard for safety, with pt use of HHA which husband can provide on d/c. Verbal cuing for sequencing with step-to stair navigation, placement of cane for stability.  Wheelchair Mobility    Modified Rankin (Stroke Patients Only)       Balance Overall balance assessment: Mild deficits observed, not formally tested                                           Pertinent Vitals/Pain Pain Assessment: 0-10 Pain Score: 8  Faces Pain Scale: Hurts little more Pain Location: anterior neck secondary to incision site Pain Descriptors / Indicators: Aching;Sore;Burning Pain Intervention(s): Limited activity within patient's tolerance;Monitored during session;Premedicated before session;Repositioned    Home Living Family/patient expects to be discharged to:: Private residence Living Arrangements: Spouse/significant  other Available Help at Discharge: Family;Available 24 hours/day(pt states a family member will be able to  stay 24/7 first 2 weeks) Type of Home: House Home Access: Stairs to enter Entrance Stairs-Rails: None Entrance Stairs-Number of Steps: 4 in front 6 in back Home Layout: One level Home Equipment: Cane - single point;Shower seat      Prior Function Level of Independence: Independent with assistive device(s)         Comments: uses cane for community ambulation, history of toe walking     Hand Dominance   Dominant Hand: Right    Extremity/Trunk Assessment   Upper Extremity Assessment Upper Extremity Assessment: Defer to OT evaluation RUE Deficits / Details: strength is functional for basic tasks, able to perform functional hand to mouth patters but is decreased in fine motor skills. 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 RUE Coordination: decreased fine motor LUE Coordination: decreased fine motor    Lower Extremity Assessment Lower Extremity Assessment: RLE deficits/detail;Overall WFL for tasks assessed;LLE deficits/detail RLE Deficits / Details: history of toe walking LLE Deficits / Details: history of toe walking    Cervical / Trunk Assessment Cervical / Trunk Assessment: Other exceptions Cervical / Trunk Exceptions: s/p ACDF  Communication   Communication: No difficulties  Cognition Arousal/Alertness: Awake/alert Behavior During Therapy: WFL for tasks assessed/performed Overall Cognitive Status: Within Functional Limits for tasks assessed                                        General Comments      Exercises Other Exercises Other Exercises: Home walking program: up and walking 1x/hour for 5-10 minute bouts with supervision, for DVT prophylaxis, promoting good truncal posture, preventing spinal stiffness   Assessment/Plan    PT Assessment Patent does not need any further PT services  PT Problem List         PT Treatment Interventions      PT Goals (Current goals can be found in the Care Plan section)  Acute Rehab PT  Goals Patient Stated Goal: decrease pain PT Goal Formulation: With patient Time For Goal Achievement: 06/18/19 Potential to Achieve Goals: Good    Frequency     Barriers to discharge        Co-evaluation               AM-PAC PT "6 Clicks" Mobility  Outcome Measure Help needed turning from your back to your side while in a flat bed without using bedrails?: None Help needed moving from lying on your back to sitting on the side of a flat bed without using bedrails?: None Help needed moving to and from a bed to a chair (including a wheelchair)?: None Help needed standing up from a chair using your arms (e.g., wheelchair or bedside chair)?: None Help needed to walk in hospital room?: A Little Help needed climbing 3-5 steps with a railing? : A Little 6 Click Score: 22    End of Session Equipment Utilized During Treatment: Cervical collar Activity Tolerance: Patient tolerated treatment well Patient left: in bed;with call bell/phone within reach;with family/visitor present Nurse Communication: Mobility status PT Visit Diagnosis: Other abnormalities of gait and mobility (R26.89)    Time: 3382-5053 PT Time Calculation (min) (ACUTE ONLY): 17 min   Charges:   PT Evaluation $PT Eval Low Complexity: 1 Low         Dinora Hemm E, PT  Acute Rehabilitation Services Pager [phone]  Office [phone]  Tyreek Clabo D Despina Hidden 06/18/2019, 10:09 AM

## 2019-06-18 NOTE — Evaluation (Signed)
Occupational Therapy Evaluation Patient Details Name: Cynthia Wilkinson MRN: [id number] DOB: 1970/11/21 Today's Date: 06/18/2019    History of Present Illness Pt is 49 y/o female s/p C5-7 ACDF. PMH includes DM2, aortic stenosis.   Clinical Impression   PTA pt living independently with spouse, working as Therapist, sports. Pt with recent exacerbation of symptoms following work accident. Pt able to complete bed mobility and transfers at mod I level. Reviewed cervical precautions in context of BADL, brace wearing schedule and don/doff, and safe IADL engagement in the home. Issued pt theraputty to strengthen bil hand Semmes. Pt is proficient in demonstration of exercise and safe precautions. No further OT needs identified. OT will sign off.    Follow Up Recommendations  No OT follow up    Equipment Recommendations  None recommended by OT;Other (comment)(gave theraputty)    Recommendations for Other Services       Precautions / Restrictions Precautions Precautions: Cervical Precaution Booklet Issued: Yes (comment) Precaution Comments: reviewed in context of BADL Required Braces or Orthoses: Cervical Brace Cervical Brace: Hard collar Restrictions Weight Bearing Restrictions: No Other Position/Activity Restrictions: okay to remove when sleeping, showering      Mobility Bed Mobility Overal bed mobility: Modified Independent                Transfers Overall transfer level: Modified independent                    Balance Overall balance assessment: Mild deficits observed, not formally tested                                         ADL either performed or assessed with clinical judgement   ADL Overall ADL's : Modified independent                                       General ADL Comments: Pt demonstrates ability to complete BADL at mod I level. Pt was able to dress self at mod I level and following cervical precautions prior to OT session. Pt is  familiar with brace management. Cervical precautions reinforced with global BADL and home safety     Vision Baseline Vision/History: Wears glasses Wears Glasses: At all times Patient Visual Report: No change from baseline       Perception     Praxis      Pertinent Vitals/Pain Pain Assessment: Faces Faces Pain Scale: Hurts little more Pain Location: cervical incision site, RUE nerve pain Pain Descriptors / Indicators: Aching;Sore;Burning Pain Intervention(s): Monitored during session     Hand Dominance     Extremity/Trunk Assessment Upper Extremity Assessment Upper Extremity Assessment: RUE deficits/detail;LUE deficits/detail RUE Deficits / Details: strength is functional for basic tasks, able to perform functional hand to mouth patters but is decreased in fine motor skills. 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   Shoulder Instructions      Home Living Family/patient expects to be discharged to:: Private residence Living Arrangements: Spouse/significant other Available Help at Discharge: Family;Available 24 hours/day(pt states a family member will be able to stay 24/7 first 2 weeks) Type of Home: House Home Access: Stairs to enter [employer] of Steps: 4 in front 6 in back Entrance Stairs-Rails: None Home Layout: One level     Bathroom  Shower/Tub: Producer, television/film/video: Standard     Home Equipment: Cane - single point;Shower seat          Prior Functioning/Environment Level of Independence: Independent with assistive device(s)        Comments: has been using cane for functional mobility with longer distances. Has shower seat but has not been using        OT Problem List: Decreased strength;Decreased knowledge of use of DME or AE;Decreased coordination;Decreased knowledge of precautions;Pain;Impaired sensation      OT Treatment/Interventions:      OT Goals(Current goals can be found in the care plan section) Acute Rehab OT Goals Patient Stated Goal: return to work OT Goal Formulation: With patient Time For Goal Achievement: 07/02/19 Potential to Achieve Goals: Good  OT Frequency:     Barriers to D/C:            Co-evaluation              AM-PAC OT "6 Clicks" Daily Activity     Outcome Measure Help from another person eating meals?: None Help from another person taking care of personal grooming?: None Help from another person toileting, which includes using toliet, bedpan, or urinal?: None Help from another person bathing (including washing, rinsing, drying)?: None Help from another person to put on and taking off regular upper body clothing?: None Help from another person to put on and taking off regular lower body clothing?: None 6 Click Score: 24   End of Session Nurse Communication: Mobility status  Activity Tolerance: Patient tolerated treatment well Patient left: in bed  OT Visit Diagnosis: Other abnormalities of gait and mobility (R26.89)                Time: 0940-7680 OT Time Calculation (min): 20 min Charges:  OT General Charges $OT Visit: 1 Visit OT Evaluation $OT Eval Low Complexity: 1 Low  Dalphine Handing, MSOT, OTR/L Acute Rehabilitation Services [facility] Office Number: [phone] Pager: [phone]  Dalphine Handing 06/18/2019, 9:19 AM

## 2019-06-18 NOTE — Progress Notes (Signed)
    Subjective: Procedure(s) (LRB): ANTERIOR CERVICAL DECOMPRESSION/DISCECTOMY FUSION C5-7 (N/A) 1 Day Post-Op  Patient reports pain as 2 on 0-10 scale.  Reports decreased arm pain reports incisional neck pain   Positive void Negative bowel movement Positive flatus Negative chest pain or shortness of breath  Objective: Vital signs in last 24 hours: Temp:  [97.4 F (36.3 C)-98.6 F (37 C)] 97.8 F (36.6 C) (04/02 0346) Pulse Rate:  [68-99] 68 (04/02 0346) Resp:  [12-19] 18 (04/02 0346) BP: (109-130)/(67-82) 125/67 (04/02 0346) SpO2:  [94 %-99 %] 99 % (04/02 0346)  Intake/Output from previous day: 04/01 0701 - 04/02 0700 In: 2040 [P.O.:240; I.V.:1600; IV Piggyback:200] Out: 25 [Blood:25]  Labs: Recent Labs    06/15/19 1124  WBC 10.0  RBC 5.18*  HCT 40.6  PLT 398   Recent Labs    06/15/19 1124  NA 134*  K 4.4  CL 101  CO2 21*  BUN 13  CREATININE 0.51  GLUCOSE 176*  CALCIUM 8.9   Recent Labs    06/15/19 1124  INR 0.9    Physical Exam: ABD soft Intact pulses distally Incision: dressing C/D/I and no drainage Compartment soft Radicular arm pain improved.  Ambulating with improved stability.  Motor strength intact Body mass index is 32.51 kg/m.  Assessment/Plan: Patient stable  xrays n/a Mobilization with physical therapy Encourage incentive spirometry Continue care  Advance diet Up with therapy  Overall patient reports significant improvement compared to baseline.   Patient is ambulating without assistive aids and has a stable gait pattern.  Right radicular arm pain is significant used.  This point pleased with her recovery.  The incision is healing well and there is no signs of dysphagia, dysphonia, or shortness of breath.  Patient be discharged home follow-up with me in 2 weeks.  Venita Lick, MD [facility] [phone]

## 2019-06-28 NOTE — Discharge Summary (Signed)
Patient ID: Cynthia Wilkinson MRN: [id number] DOB/AGE: 17-Feb-1971 33 y.o.  Admit date: 06/17/2019 Discharge date: 06/28/2019  Admission Diagnoses:  Active Problems:   Cervical myelopathy [facility])   Discharge Diagnoses:  Active Problems:   Cervical myelopathy (HCC)  status post Procedure(s): ANTERIOR CERVICAL DECOMPRESSION/DISCECTOMY FUSION C5-7  Past Medical History:  Diagnosis Date  . ADHD   . Anxiety   . Aortic stenosis   . Arthritis   . Constipation    3/30/ 2021- frequent since injury 04/2019  . DM2 (diabetes mellitus, type 2) (HCC)    on insulin, controlled  . Head injury 05/08/2019   not sure if she lots concious  . Headache   . Heart murmur    when pregnant 2011  . OCD (obsessive compulsive disorder)   . Panic attacks   . Unspecified essential hypertension     Surgeries: Procedure(s): ANTERIOR CERVICAL DECOMPRESSION/DISCECTOMY FUSION C5-7 on 06/17/2019   Consultants:   Discharged Condition: Improved  Hospital Course: Cynthia Wilkinson is an 49 y.o. female who was admitted 06/17/2019 for operative treatment of cervical myelopathy. Patient failed conservative treatments (please see the history and physical for the specifics) and had severe unremitting pain that affects sleep, daily activities and work/hobbies. After pre-op clearance, the patient was taken to the operating room on 06/17/2019 and underwent  Procedure(s): ANTERIOR CERVICAL DECOMPRESSION/DISCECTOMY FUSION C5-7.    Patient was given perioperative antibiotics:  Anti-infectives (From admission, onward)   Start     Dose/Rate Route Frequency Ordered Stop   06/17/19 1400  ceFAZolin (ANCEF) IVPB 1 g/50 mL premix     1 g 100 mL/hr over 30 Minutes Intravenous Every 8 hours 06/17/19 1344 06/18/19 0730   06/17/19 0548  ceFAZolin (ANCEF) IVPB 2g/100 mL premix     2 g 200 mL/hr over 30 Minutes Intravenous 30 min pre-op 06/17/19 0548 06/17/19 3614       Patient was given sequential compression devices and early  ambulation to prevent DVT.   Patient benefited maximally from hospital stay and there were no complications. At the time of discharge, the patient was urinating/moving their bowels without difficulty, tolerating a regular diet, pain is controlled with oral pain medications and they have been cleared by PT/OT.   Recent vital signs: No data found.   Recent laboratory studies: No results for input(s): WBC, HGB, HCT, PLT, NA, K, CL, CO2, BUN, CREATININE, GLUCOSE, INR, CALCIUM in the last 72 hours.  Invalid input(s): PT, 2   Discharge Medications:   Allergies as of 06/18/2019      Reactions   Gabapentin Other (See Comments)   "Made me feel funny"   Metformin Hcl Diarrhea      Medication List    STOP taking these medications   aspirin EC 81 MG tablet   Calcium 500 1250 (500 Ca) MG tablet Generic drug: calcium carbonate   ibuprofen 200 MG tablet Commonly known as: ADVIL   vitamin C 1000 MG tablet   Voltaren 1 % Gel Generic drug: diclofenac Sodium     TAKE these medications   ALPRAZolam 0.5 MG tablet Commonly known as: XANAX Take 0.5 mg by mouth 3 (three) times daily as needed for anxiety.   amphetamine-dextroamphetamine 10 MG 24 hr capsule Commonly known as: ADDERALL XR Take 10 mg by mouth 2 (two) times daily.   amphetamine-dextroamphetamine 10 MG tablet Commonly known as: ADDERALL Take 10 mg by mouth 2 (two) times daily.   insulin regular 100 units/mL injection Commonly known as: NOVOLIN R Inject 0-40  Units into the skin 3 (three) times daily as needed for high blood sugar (blood sugar greater than 250).   lisinopril 10 MG tablet Commonly known as: ZESTRIL Take 10 mg by mouth 2 (two) times daily.   Melatonin 12 MG Tabs Take 24 mg by mouth at bedtime.   metoprolol tartrate 100 MG tablet Commonly known as: LOPRESSOR Take 100 mg by mouth in the morning and at bedtime.   ondansetron 4 MG tablet Commonly known as: Zofran Take 1 tablet (4 mg total) by mouth every 8  (eight) hours as needed for nausea or vomiting.   Victoza 18 MG/3ML Sopn Generic drug: liraglutide Inject 1.8 mg into the skin daily.     ASK your doctor about these medications   methocarbamol 500 MG tablet Commonly known as: Robaxin Take 1 tablet (500 mg total) by mouth every 8 (eight) hours as needed for up to 5 days for muscle spasms. Ask about: Should I take this medication?   oxyCODONE-acetaminophen 10-325 MG tablet Commonly known as: Percocet Take 1 tablet by mouth every 6 (six) hours as needed for up to 5 days for pain. Ask about: Should I take this medication?       Diagnostic Studies: DG Chest 2 View  Result Date: 06/15/2019 CLINICAL DATA:  Preoperative respiratory exam for cervical fusion. Hypertension and diabetes. EXAM: CHEST - 2 VIEW COMPARISON:  None. FINDINGS: Heart size is normal. Mediastinal shadows are normal. The lungs are clear. No bronchial thickening. No infiltrate, mass, effusion or collapse. Pulmonary vascularity is normal. No bony abnormality. IMPRESSION: Normal chest Electronically Signed   By: Paulina Fusi M.D.   On: 06/15/2019 16:15   DG Cervical Spine 2-3 Views  Result Date: 06/17/2019 CLINICAL DATA:  Cervical fusion. EXAM: CERVICAL SPINE - 2-3 VIEW COMPARISON:  None. FINDINGS: Fluoroscopic spot films from the operating room demonstrate anterior plate and screws and interbody fusion devices at C5-6 and C6-7. Good position and alignment without complicating features. IMPRESSION: Anterior and interbody fusion devices at C5-6 and C6-7. No complicating features. Electronically Signed   By: Rudie Meyer M.D.   On: 06/17/2019 12:16   DG C-Arm 1-60 Min  Result Date: 06/17/2019 CLINICAL DATA:  Cervical disc disease at C5-6 and C6-7. EXAM: DG C-ARM 1-60 MIN FLUOROSCOPY TIME:  Fluoroscopy Time:  43 seconds COMPARISON:  None. IMPRESSION: C-arm imaging provided for cervical disc surgery. Electronically Signed   By: Francene Boyers M.D.   On: 06/17/2019 12:17     Discharge Instructions    Incentive spirometry RT   Complete by: As directed       Follow-up Information    Venita Lick, MD. Schedule an appointment as soon as possible for a visit in 2 weeks.   Specialty: Orthopedic Surgery Why: If symptoms worsen, For suture removal, For wound re-check Contact information: [address] [place] [address] [phone]           Discharge Plan:  discharge to home  Disposition: stable    Signed: Leonette Monarch Finnlee Silvernail for [facility] PA-C [facility] [phone] 06/28/2019, 8:07 AM

## 2019-07-16 DIAGNOSIS — M7052 Other bursitis of knee, left knee: Secondary | ICD-10-CM | POA: Insufficient documentation

## 2019-07-16 DIAGNOSIS — M7051 Other bursitis of knee, right knee: Secondary | ICD-10-CM | POA: Insufficient documentation

## 2019-07-16 HISTORY — DX: Other bursitis of knee, left knee: M70.52

## 2019-07-16 HISTORY — DX: Other bursitis of knee, right knee: M70.51

## 2019-08-22 DIAGNOSIS — F319 Bipolar disorder, unspecified: Secondary | ICD-10-CM

## 2019-08-22 HISTORY — DX: Bipolar disorder, unspecified: F31.9

## 2019-11-30 DIAGNOSIS — M25569 Pain in unspecified knee: Secondary | ICD-10-CM | POA: Insufficient documentation

## 2019-11-30 HISTORY — DX: Pain in unspecified knee: M25.569

## 2021-04-02 IMAGING — RF DG CERVICAL SPINE 2 OR 3 VIEWS
1 series · 3 of 3 positions shown · non-contrast
Comparison: None.

CLINICAL DATA: Cervical fusion.

EXAM:
CERVICAL SPINE - 2-3 VIEW

[Series 1: run · 3 of 3 slices shown]
[im 1/3]
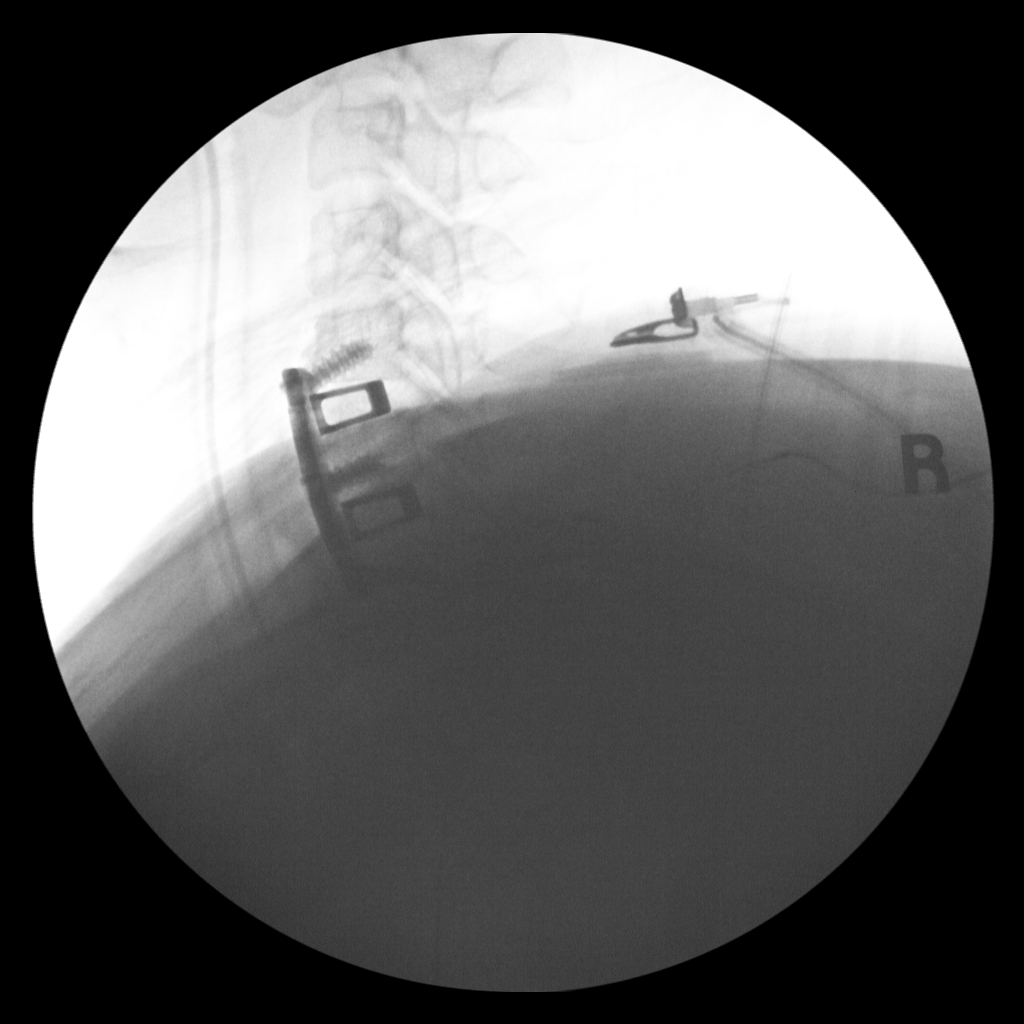
[im 2/3]
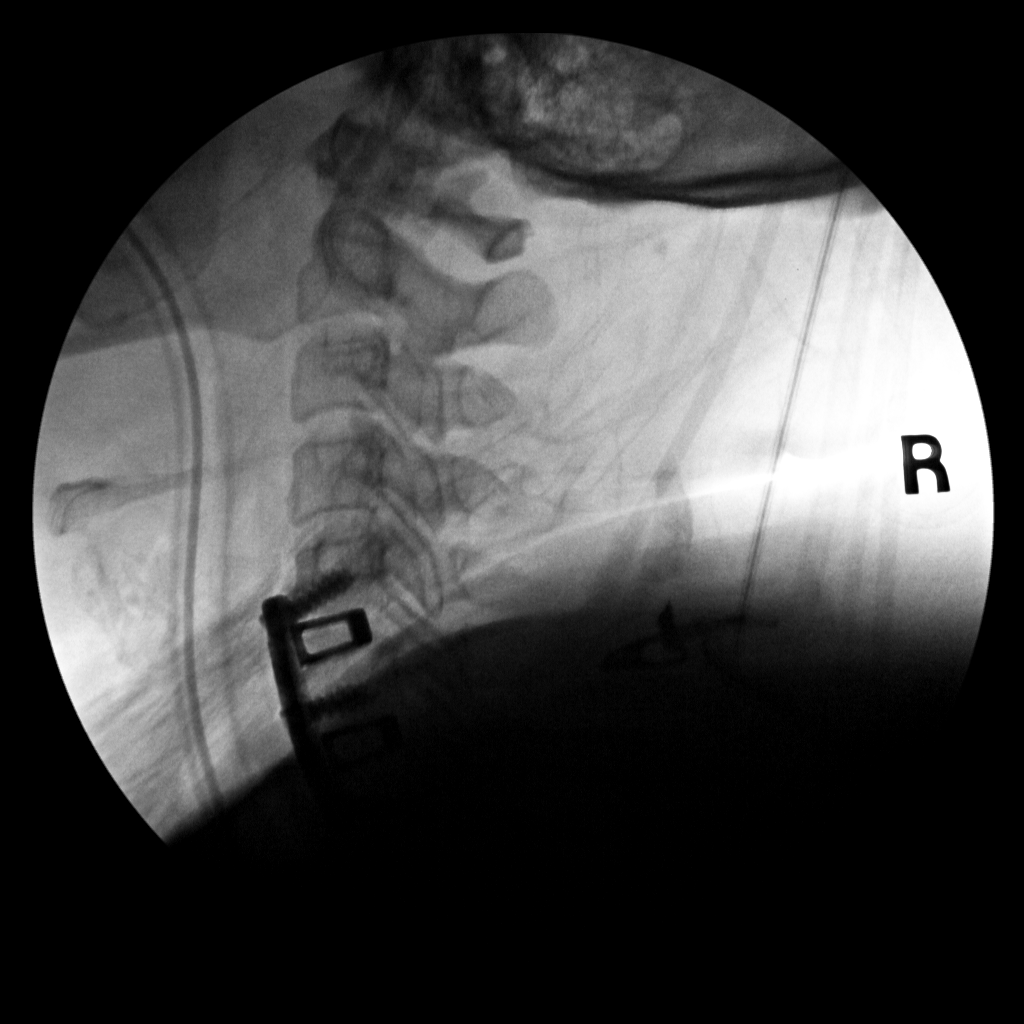
[im 3/3]
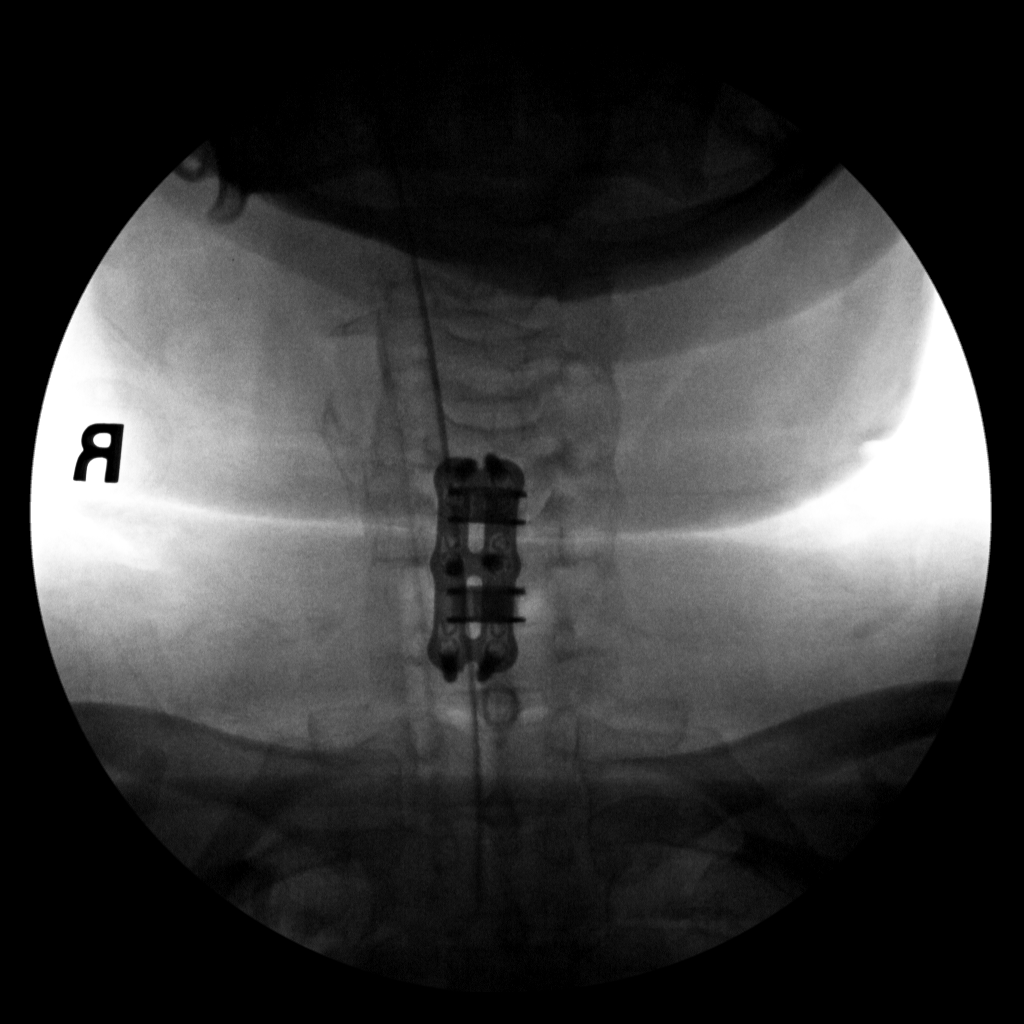

[3 of 3 positions shown; findings below may reference images not displayed]

FINDINGS: Fluoroscopic spot films from the operating room demonstrate anterior
plate and screws and interbody fusion devices at C5-6 and C6-7. Good
position and alignment without complicating features.
IMPRESSION: Anterior and interbody fusion devices at C5-6 and C6-7. No
complicating features.

## 2021-04-02 IMAGING — RF DG C-ARM 1-60 MIN
1 series · 3 of 3 positions shown · non-contrast
Comparison: None.

CLINICAL DATA: Cervical disc disease at C5-6 and C6-7.

EXAM:
DG C-ARM 1-60 MIN
FLUOROSCOPY TIME:  Fluoroscopy Time:  43 seconds

[Series 1: run · 3 of 3 slices shown]
[im 1/3]
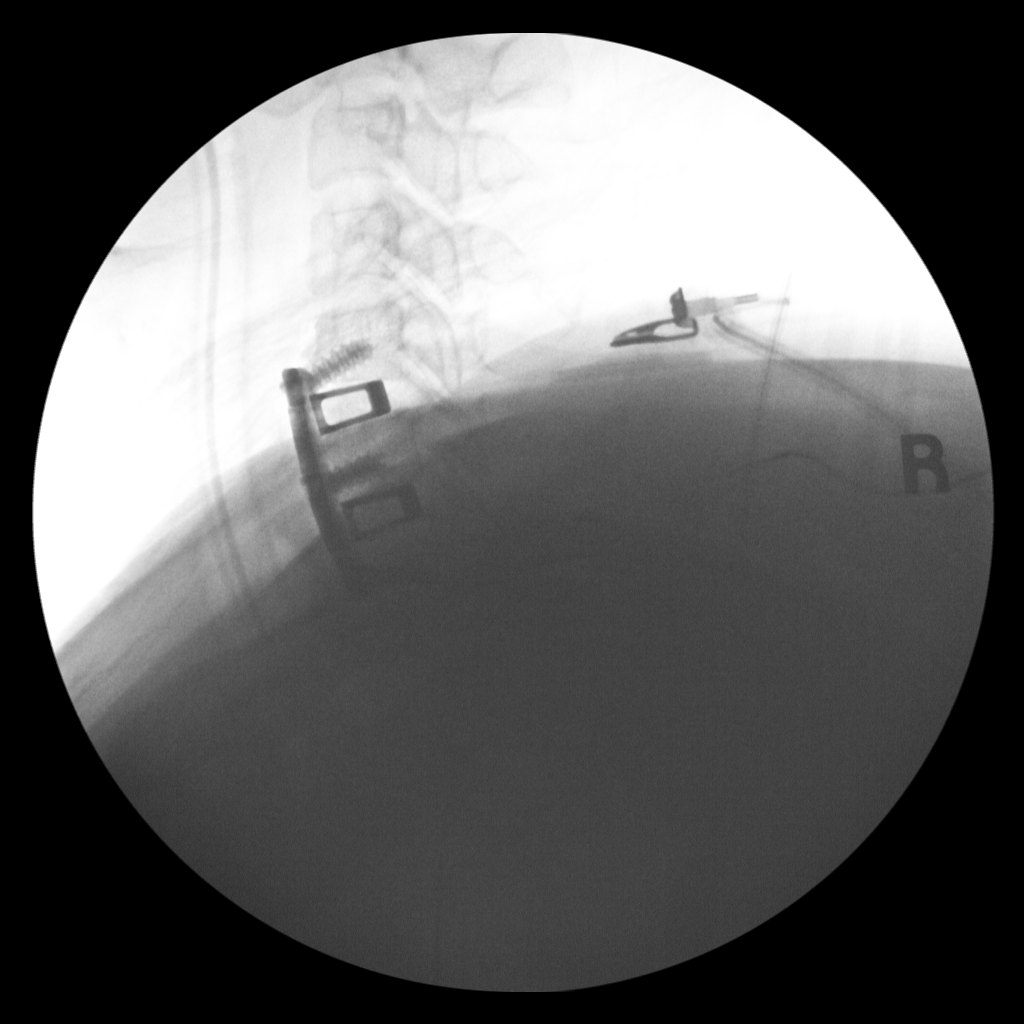
[im 2/3]
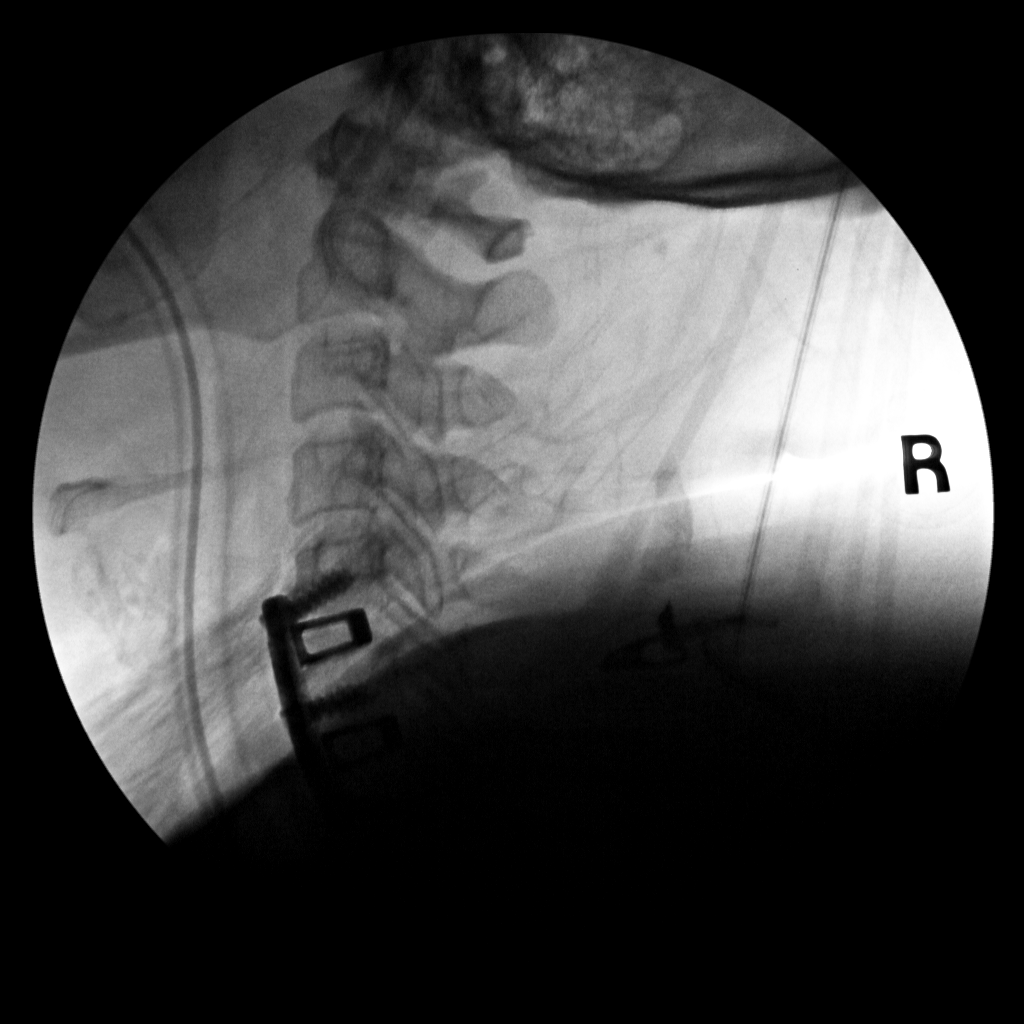
[im 3/3]
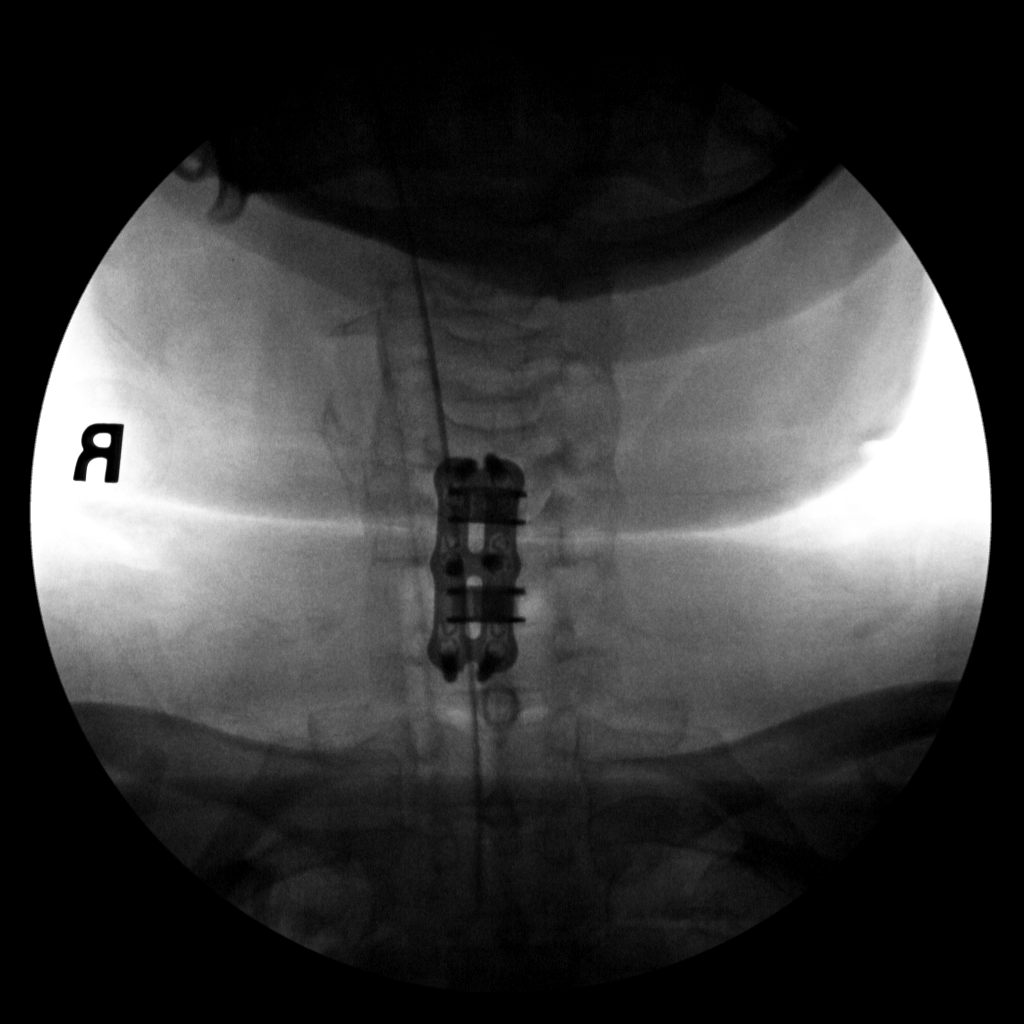

[3 of 3 positions shown; findings below may reference images not displayed]

IMPRESSION: C-arm imaging provided for cervical disc surgery.

## 2022-12-03 ENCOUNTER — Encounter: Payer: Self-pay | Admitting: Physician Assistant

## 2022-12-03 ENCOUNTER — Other Ambulatory Visit: Payer: Self-pay | Admitting: Physician Assistant

## 2022-12-03 DIAGNOSIS — Z1231 Encounter for screening mammogram for malignant neoplasm of breast: Secondary | ICD-10-CM

## 2023-01-09 ENCOUNTER — Ambulatory Visit: Payer: 59

## 2023-01-24 ENCOUNTER — Other Ambulatory Visit: Payer: Self-pay

## 2023-01-24 DIAGNOSIS — E1165 Type 2 diabetes mellitus with hyperglycemia: Secondary | ICD-10-CM | POA: Insufficient documentation

## 2023-01-24 DIAGNOSIS — F419 Anxiety disorder, unspecified: Secondary | ICD-10-CM | POA: Insufficient documentation

## 2023-01-24 DIAGNOSIS — F909 Attention-deficit hyperactivity disorder, unspecified type: Secondary | ICD-10-CM | POA: Insufficient documentation

## 2023-01-24 DIAGNOSIS — F329 Major depressive disorder, single episode, unspecified: Secondary | ICD-10-CM | POA: Insufficient documentation

## 2023-01-24 DIAGNOSIS — E119 Type 2 diabetes mellitus without complications: Secondary | ICD-10-CM | POA: Insufficient documentation

## 2023-01-24 DIAGNOSIS — F411 Generalized anxiety disorder: Secondary | ICD-10-CM | POA: Insufficient documentation

## 2023-01-24 DIAGNOSIS — F410 Panic disorder [episodic paroxysmal anxiety] without agoraphobia: Secondary | ICD-10-CM | POA: Insufficient documentation

## 2023-01-24 DIAGNOSIS — R011 Cardiac murmur, unspecified: Secondary | ICD-10-CM | POA: Insufficient documentation

## 2023-01-24 DIAGNOSIS — R519 Headache, unspecified: Secondary | ICD-10-CM | POA: Insufficient documentation

## 2023-01-24 DIAGNOSIS — F4310 Post-traumatic stress disorder, unspecified: Secondary | ICD-10-CM | POA: Insufficient documentation

## 2023-01-24 DIAGNOSIS — K5900 Constipation, unspecified: Secondary | ICD-10-CM | POA: Insufficient documentation

## 2023-01-24 DIAGNOSIS — I350 Nonrheumatic aortic (valve) stenosis: Secondary | ICD-10-CM | POA: Insufficient documentation

## 2023-01-24 DIAGNOSIS — F429 Obsessive-compulsive disorder, unspecified: Secondary | ICD-10-CM | POA: Insufficient documentation

## 2023-01-24 DIAGNOSIS — M1990 Unspecified osteoarthritis, unspecified site: Secondary | ICD-10-CM | POA: Insufficient documentation

## 2023-01-27 ENCOUNTER — Ambulatory Visit: Payer: 59

## 2023-02-04 ENCOUNTER — Ambulatory Visit: Payer: 59

## 2023-02-28 ENCOUNTER — Ambulatory Visit
Admission: RE | Admit: 2023-02-28 | Discharge: 2023-02-28 | Disposition: A | Discharge status: Home / Self Care | Payer: 59 | Source: Ambulatory Visit | Attending: Physician Assistant | Admitting: Physician Assistant

## 2023-02-28 DIAGNOSIS — Z1231 Encounter for screening mammogram for malignant neoplasm of breast: Secondary | ICD-10-CM

## 2023-11-13 ENCOUNTER — Ambulatory Visit: Payer: Self-pay | Admitting: Occupational Therapy

## 2023-11-13 ENCOUNTER — Ambulatory Visit: Attending: Cardiology | Admitting: Cardiology

## 2023-11-13 ENCOUNTER — Encounter: Payer: Self-pay | Admitting: Cardiology

## 2023-11-13 VITALS — BP 98/64 | HR 64 | Ht <= 58 in | Wt 121.1 lb

## 2023-11-13 DIAGNOSIS — E1165 Type 2 diabetes mellitus with hyperglycemia: Secondary | ICD-10-CM | POA: Insufficient documentation

## 2023-11-13 DIAGNOSIS — I517 Cardiomegaly: Secondary | ICD-10-CM | POA: Diagnosis not present

## 2023-11-13 DIAGNOSIS — R011 Cardiac murmur, unspecified: Secondary | ICD-10-CM | POA: Diagnosis not present

## 2023-11-13 DIAGNOSIS — I359 Nonrheumatic aortic valve disorder, unspecified: Secondary | ICD-10-CM | POA: Insufficient documentation

## 2023-11-13 DIAGNOSIS — F419 Anxiety disorder, unspecified: Secondary | ICD-10-CM | POA: Insufficient documentation

## 2023-11-13 DIAGNOSIS — R072 Precordial pain: Secondary | ICD-10-CM | POA: Diagnosis not present

## 2023-11-13 DIAGNOSIS — I10 Essential (primary) hypertension: Secondary | ICD-10-CM | POA: Insufficient documentation

## 2023-11-13 MED ORDER — METOPROLOL TARTRATE 100 MG PO TABS: 100.0000 mg | ORAL_TABLET | Freq: Two times a day (BID) | ORAL | 3 refills | Status: AC

## 2023-11-13 MED ORDER — LISINOPRIL 10 MG PO TABS: 10.0000 mg | ORAL_TABLET | Freq: Two times a day (BID) | ORAL | 3 refills | Status: AC

## 2023-11-13 NOTE — Addendum Note (Signed)
 Addended by: ARLOA PLANAS D on: 11/13/2023 03:30 PM   Modules accepted: Orders

## 2023-11-13 NOTE — Progress Notes (Signed)
 Cardiology Consultation:    Date:  11/13/2023   ID:  Cynthia Wilkinson, DOB 1970/06/14, MRN [id number]  PCP:  Sheldon Netter, PA  Cardiologist:  Lamar Fitch, MD   Referring MD: Sheldon Netter, PA   No chief complaint on file.   History of Present Illness:    Cynthia Wilkinson is a 53 y.o. female who is being seen today for the evaluation of heart murmur at the request of Sheldon Netter, [place].  Past medical history significant for longstanding diabetes, essential hypertension, dyslipidemia, smoking, she was referred to us  because of history of heart murmur.  Many years ago she was told to have a heart murmur however was told not to be worried about that.  She now try to look at her health more Carefully she would like to have this investigated.  She said she can walk climb steps but get tired quite easily denies having atypical chest pain tightness squeezing pressure burning chest.  Recently she ended up being in a cruise in [place]  initially she was able to walk but after that she had to use wheelchair.  That happened at the airport.  Smokes to try to reduce previously used Chantix but now psychiatrist told her that is not a good idea.  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  Does have bipolar disorder.  Family history multiple members with premature coronary disease  Past Medical History:  Diagnosis Date   Acute bronchitis due to other specified organisms 03/13/2016   ADHD    Adjustment disorder with anxiety 09/15/2013   Last Assessment & Plan:    Using alprazolam  as needed on a fairly infrequent basis.  Continue as needed for now.     Anterior knee pain 11/30/2019   Anxiety    Aortic stenosis    Aortic valve disorder 01/12/2009   Qualifier: Diagnosis of   By: Wynetta REYNOLDS Niels KATRINA SNOMED Dx Update Oct 2024     Arthritis    Benign neoplasm of skin 09/15/2013   Bipolar disorder (HCC) 08/22/2019   Cervical myelopathy (HCC) 06/17/2019   Constipation    3/30/ 2021-  frequent since injury 04/2019   DM2 (diabetes mellitus, type 2) (HCC)    on insulin , controlled   Essential hypertension    GAD (generalized anxiety disorder)    Generalized abdominal pain 03/13/2016   Head injury 05/08/2019   not sure if she lots concious   Headache    Heart murmur    when pregnant 2011   Heart murmur, systolic    Left ventricular hypertrophy 09/15/2013   MDD (major depressive disorder)    Melasma 09/15/2013   Obesity 09/15/2013   OCD (obsessive compulsive disorder)    Open angle with borderline findings and low glaucoma risk in both eyes 04/18/2016   Osteoarthritis of spine 09/15/2013   Pain in thoracic spine 05/20/2019   Panic attacks    Pes anserinus bursitis of left knee 07/16/2019   Pes anserinus bursitis of right knee 07/16/2019   Polypharmacy 06/05/2017   Primary insomnia 06/27/2017   PTSD (post-traumatic stress disorder)    Skin lesion of face 12/09/2013   Type 2 diabetes mellitus with hyperglycemia, without long-term current use of insulin  (HCC)    Xerosis cutis 09/15/2013    Past Surgical History:  Procedure Laterality Date   ANTERIOR CERVICAL DECOMP/DISCECTOMY FUSION N/A 06/17/2019   Procedure: ANTERIOR CERVICAL DECOMPRESSION/DISCECTOMY FUSION C5-7;  Surgeon: Burnetta Aures, MD;  Location: [facility] OR;  Service: Orthopedics;  Laterality: N/A;  3 hrs   CESAREAN SECTION     WISDOM TOOTH EXTRACTION     had nitroeus     Current Medications: Current Meds  Medication Sig   ALPRAZolam  (XANAX ) 0.5 MG tablet Take 0.5 mg by mouth every morning. And takes 1 mg by mouth at bedtime   amphetamine-dextroamphetamine (ADDERALL XR) 10 MG 24 hr capsule Take 10 mg by mouth daily as needed.   amphetamine-dextroamphetamine (ADDERALL XR) 20 MG 24 hr capsule Take 20 mg by mouth every morning.   aspirin EC 81 MG tablet Take 81 mg by mouth daily.   DULoxetine (CYMBALTA) 20 MG capsule Take 20 mg by mouth 2 (two) times daily.   insulin  regular (NOVOLIN R) 100 units/mL  injection as directed Injection sliding scale as needed takes 50 units for blood sugars of 250; 100 units for blood sugars >300   JARDIANCE 25 MG TABS tablet Take 25 mg by mouth daily.   lisinopril  (ZESTRIL ) 10 MG tablet Take 10 mg by mouth 2 (two) times daily.   metoprolol  tartrate (LOPRESSOR ) 100 MG tablet Take 100 mg by mouth in the morning and at bedtime.   rosuvastatin (CRESTOR) 20 MG tablet Take 20 mg by mouth daily.   [DISCONTINUED] glipiZIDE (GLUCOTROL) 10 MG tablet Take 10 mg by mouth 2 (two) times daily before a meal.   [DISCONTINUED] hydrOXYzine (ATARAX) 10 MG tablet Take 10 mg by mouth daily.     Allergies:   Latex, Aripiprazole, Carbamazepine, Chlordiazepoxide, Duloxetine, Fluoxetine, Gabapentin, Haloperidol, Hydromorphone, Pregabalin, Quetiapine, Risperidone, Sertraline, Trazodone, Valproic acid, Ziprasidone hcl, and Metformin hcl   Social History   Socioeconomic History   Marital status: Married    Spouse name: Not on file   Number of children: Not on file   Years of education: Not on file   Highest education level: Not on file  Occupational History   Not on file  Tobacco Use   Smoking status: Every Day   Smokeless tobacco: Never   Tobacco comments:     1- 3 cigs a day  Vaping Use   Vaping status: Every Day  Substance and Sexual Activity   Alcohol use: Yes    Alcohol/week: 7.0 standard drinks of alcohol    Types: 7 Glasses of wine per week    Comment: Not during pregnancy though    Drug use: No   Sexual activity: Not on file  Other Topics Concern   Not on file  Social History Narrative   Full time    Social Drivers of Health   Financial Resource Strain: Not on file  Food Insecurity: Not on file  Transportation Needs: Not on file  Physical Activity: Not on file  Stress: Not on file  Social Connections: Unknown (08/02/2021)   Received from [employer]   Social Network    Social Network: Not on file     Family History: The patient's family history  includes Cancer in an other family member; Coronary artery disease in her father, paternal aunt, and paternal aunt; Hypertension in her mother. There is no history of Breast cancer. ROS:   Please see the history of present illness.    All 14 point review of systems negative except as described per history of present illness.  EKGs/Labs/Other Studies Reviewed:    The following studies were reviewed today:   EKG:  EKG Interpretation Date/Time:  Thursday November 13 2023 14:38:32 EDT Ventricular Rate:  64 PR Interval:  160 QRS Duration:  74  QT Interval:  364 QTC Calculation: 375 R Axis:   81  Text Interpretation: Normal sinus rhythm Normal ECG When compared with ECG of 17-Jun-2019 05:55, Criteria for Anterior infarct are no longer Present Confirmed by Bernie Charleston [id number]) on 11/13/2023 2:46:30 PM    Recent Labs: No results found for requested labs within last 365 days.  Recent Lipid Panel No results found for: CHOL, TRIG, HDL, CHOLHDL, VLDL, LDLCALC, LDLDIRECT  Physical Exam:    VS:  BP 98/64   Pulse 64   Ht 4' 8 (1.422 m)   Wt 121 lb 1.3 oz (54.9 kg)   LMP  (LMP Unknown) Comment: January 2025  SpO2 97%   BMI 27.15 kg/m     Wt Readings from Last 3 Encounters:  11/13/23 121 lb 1.3 oz (54.9 kg)  06/17/19 145 lb (65.8 kg)  06/15/19 144 lb 14.4 oz (65.7 kg)     GEN:  Well nourished, well developed in no acute distress HEENT: Normal NECK: No JVD; No carotid bruits LYMPHATICS: No lymphadenopathy CARDIAC: RRR, soft diastolic murmur best heard at left border of the sternum 1/6, no rubs, no gallops RESPIRATORY:  Clear to auscultation without rales, wheezing or rhonchi  ABDOMEN: Soft, non-tender, non-distended MUSCULOSKELETAL:  No edema; No deformity  SKIN: Warm and dry NEUROLOGIC:  Alert and oriented x 3 PSYCHIATRIC:  Normal affect   ASSESSMENT:    1. Heart murmur   2. Aortic valve disorder   3. Essential hypertension   4. Left ventricular hypertrophy    5. Type 2 diabetes mellitus with hyperglycemia, without long-term current use of insulin  (HCC)   6. Anxiety    PLAN:    In order of problems listed above:  Heart murmur mildly auscultable.  Will get echocardiogram to clarify the nature of it.  I do not anticipate this to be a serious problem but again echocardiogram will be done. Numerous risk factors for coronary artery disease namely dyslipidemia poorly controlled, diabetes poorly controlled, essential hypertension, smoking, family history of premature coronary artery disease I think we must rule out coronary artery disease as symptoms of shortness of breath seems to be quite pronounced.  Will schedule her to have coronary CT angio in the meantime she is taking very antiplatelet therapy which I will continue she is already on statin. Diabetes.  I did review K PN which show me her last hemoglobin A1c of 7.6.  Will continue monitoring. Dyslipidemia she is on Crestor 20 and her LDL was 129 HDL 62 will wait for coronary CT angio to make a decision about adjustment of her medications   Medication Adjustments/Labs and Tests Ordered: Current medicines are reviewed at length with the patient today.  Concerns regarding medicines are outlined above.  Orders Placed This Encounter  Procedures   EKG 12-Lead   No orders of the defined types were placed in this encounter.   Signed, [place] DOROTHA Bernie, MD, [facility]. 11/13/2023 3:12 PM    [facility]

## 2023-11-13 NOTE — Patient Instructions (Addendum)
 Medication Instructions:   Take: Dose of Metoprolol  100mg  2 hours prior to CT Scan   Lab Work: 3rd Floor [address] BMP-today If you have labs (blood work) drawn today and your tests are completely normal, you will receive your results only by: [employer] (if you have MyChart) OR A paper copy in the mail If you have any lab test that is abnormal or we need to change your treatment, we will call you to review the results.   Testing/Procedures:  Your physician has requested that you have an echocardiogram. Echocardiography is a painless test that uses sound waves to create images of your heart. It provides your doctor with information about the size and shape of your heart and how well your heart's chambers and valves are working. This procedure takes approximately one hour. There are no restrictions for this procedure. Please do NOT wear cologne, perfume, aftershave, or lotions (deodorant is allowed). Please arrive 15 minutes prior to your appointment time.  Please note: We ask at that you not bring children with you during ultrasound (echo/ vascular) testing. Due to room size and safety concerns, children are not allowed in the ultrasound rooms during exams. Our front office staff cannot provide observation of children in our lobby area while testing is being conducted. An adult accompanying a patient to their appointment will only be allowed in the ultrasound room at the discretion of the ultrasound technician under special circumstances. We apologize for any inconvenience.   Your cardiac CT will be scheduled at one of the below locations:   [facility] [address] [place], [place] [address]  Please follow these instructions carefully (unless otherwise directed):     On the Night Before the Test: Be sure to Drink plenty of water. Do not consume any caffeinated/decaffeinated beverages or chocolate 12 hours prior to your test. Do not take any antihistamines 12 hours  prior to your test.   On the Day of the Test: Drink plenty of water until 1 hour prior to the test. Do not eat any food 4 hours prior to the test. You may take your regular medications prior to the test.  Take metoprolol  (Lopressor ) two hours prior to test. FEMALES- please wear underwire-free bra if available, avoid dresses & tight clothing       After the Test: Drink plenty of water. After receiving IV contrast, you may experience a mild flushed feeling. This is normal. On occasion, you may experience a mild rash up to 24 hours after the test. This is not dangerous. If this occurs, you can take Benadryl  25 mg and increase your fluid intake. If you experience trouble breathing, this can be serious. If it is severe call 911 IMMEDIATELY. If it is mild, please call our office. If you take any of these medications: Glipizide/Metformin, Avandament, Glucavance, please do not take 48 hours after completing test unless otherwise instructed.  We will call to schedule your test 2-4 weeks out understanding that some insurance companies will need an authorization prior to the service being performed.   For non-scheduling related questions, please contact the cardiac imaging nurse navigator should you have any questions/concerns: Camie Shutter, Cardiac Imaging Nurse Navigator Chantal Requena, Cardiac Imaging Nurse Navigator [facility] Direct Office Dial: [phone]   For scheduling needs, including cancellations and rescheduling, please call [place], [phone].    Follow-Up: At [facility], you and your health needs are our priority.  As part of our continuing mission to  provide you with exceptional heart care, we have created designated Provider Care Teams.  These Care Teams include your primary Cardiologist (physician) and Advanced Practice Providers (APPs -  Physician Assistants and Nurse Practitioners) who all work together to provide you with the care you  need, when you need it.  We recommend signing up for the patient portal called MyChart.  Sign up information is provided on this After Visit Summary.  MyChart is used to connect with patients for Virtual Visits (Telemedicine).  Patients are able to view lab/test results, encounter notes, upcoming appointments, etc.  Non-urgent messages can be sent to your provider as well.   To learn more about what you can do with MyChart, go to [email or web address].    Your next appointment:   2 month(s)  The format for your next appointment:   In Person  Provider:   Lamar Fitch, MD    Other Instructions NA

## 2023-11-14 LAB — BASIC METABOLIC PANEL WITH GFR
BUN/Creatinine Ratio: 21 (ref 9–23)
BUN: 15 mg/dL (ref 6–24)
CO2: 22 mmol/L (ref 20–29)
Calcium: 9.5 mg/dL (ref 8.7–10.2)
Chloride: 103 mmol/L (ref 96–106)
Creatinine, Ser: 0.71 mg/dL (ref 0.57–1.00)
Glucose: 143 mg/dL — ABNORMAL HIGH (ref 70–99)
Potassium: 4.6 mmol/L (ref 3.5–5.2)
Sodium: 141 mmol/L (ref 134–144)
eGFR: 102 mL/min/1.73 (ref >59)

## 2023-11-16 ENCOUNTER — Other Ambulatory Visit: Payer: Self-pay | Admitting: Cardiology

## 2023-11-16 DIAGNOSIS — I517 Cardiomegaly: Secondary | ICD-10-CM

## 2023-11-16 DIAGNOSIS — E1165 Type 2 diabetes mellitus with hyperglycemia: Secondary | ICD-10-CM

## 2023-11-16 DIAGNOSIS — F419 Anxiety disorder, unspecified: Secondary | ICD-10-CM

## 2023-11-16 DIAGNOSIS — I359 Nonrheumatic aortic valve disorder, unspecified: Secondary | ICD-10-CM

## 2023-11-16 DIAGNOSIS — I10 Essential (primary) hypertension: Secondary | ICD-10-CM

## 2023-11-16 DIAGNOSIS — R072 Precordial pain: Secondary | ICD-10-CM

## 2023-11-16 DIAGNOSIS — R011 Cardiac murmur, unspecified: Secondary | ICD-10-CM

## 2023-11-16 DIAGNOSIS — R0600 Dyspnea, unspecified: Secondary | ICD-10-CM

## 2023-11-21 ENCOUNTER — Encounter (HOSPITAL_COMMUNITY): Payer: Self-pay

## 2023-11-24 ENCOUNTER — Telehealth (HOSPITAL_COMMUNITY): Payer: Self-pay | Admitting: Emergency Medicine

## 2023-11-24 NOTE — Telephone Encounter (Signed)
 CCTA r/s to 9/23 due to delay in auth.  Camie Shutter RN Navigator Cardiac Imaging [facility] [phone] Office  [phone] Cell

## 2023-11-25 ENCOUNTER — Ambulatory Visit (HOSPITAL_BASED_OUTPATIENT_CLINIC_OR_DEPARTMENT_OTHER): Admission: RE | Admit: 2023-11-25 | Source: Ambulatory Visit

## 2023-12-01 ENCOUNTER — Ambulatory Visit: Attending: Sports Medicine | Admitting: Occupational Therapy

## 2023-12-05 ENCOUNTER — Encounter (HOSPITAL_COMMUNITY): Payer: Self-pay

## 2023-12-09 ENCOUNTER — Ambulatory Visit (HOSPITAL_BASED_OUTPATIENT_CLINIC_OR_DEPARTMENT_OTHER): Admission: RE | Admit: 2023-12-09 | Source: Ambulatory Visit

## 2023-12-11 ENCOUNTER — Ambulatory Visit (HOSPITAL_BASED_OUTPATIENT_CLINIC_OR_DEPARTMENT_OTHER)
Admission: RE | Admit: 2023-12-11 | Discharge: 2023-12-11 | Disposition: A | Discharge status: Home / Self Care | Source: Ambulatory Visit | Attending: Cardiology | Admitting: Cardiology

## 2023-12-11 DIAGNOSIS — R072 Precordial pain: Secondary | ICD-10-CM | POA: Diagnosis present

## 2023-12-11 DIAGNOSIS — R011 Cardiac murmur, unspecified: Secondary | ICD-10-CM | POA: Insufficient documentation

## 2023-12-11 DIAGNOSIS — R0600 Dyspnea, unspecified: Secondary | ICD-10-CM | POA: Insufficient documentation

## 2023-12-11 DIAGNOSIS — R0609 Other forms of dyspnea: Secondary | ICD-10-CM

## 2023-12-14 LAB — ECHOCARDIOGRAM COMPLETE
AR max vel: 1.55 cm2
AV Area VTI: 1.57 cm2
AV Area mean vel: 1.48 cm2
AV Mean grad: 5 mmHg
AV Peak grad: 8.8 mmHg
Ao pk vel: 1.48 m/s
Area-P 1/2: 3.6 cm2
Calc EF: 75.6 %
MV M vel: 3.96 m/s
MV Peak grad: 62.7 mmHg
S' Lateral: 2.05 cm
Single Plane A2C EF: 75.6 %
Single Plane A4C EF: 76 %

## 2023-12-17 ENCOUNTER — Ambulatory Visit: Payer: Self-pay | Admitting: Cardiology

## 2023-12-22 ENCOUNTER — Ambulatory Visit: Attending: Sports Medicine | Admitting: Occupational Therapy

## 2023-12-22 ENCOUNTER — Telehealth: Payer: Self-pay

## 2023-12-22 NOTE — Telephone Encounter (Signed)
 Left message on My Chart with Echo results per Dr. Karry note. Routed to PCP.

## 2024-01-12 ENCOUNTER — Telehealth: Payer: Self-pay

## 2024-01-12 NOTE — Telephone Encounter (Signed)
 Echo Results reviewed with pt as per Dr. Vanetta Shawl note.  Pt verbalized understanding and had no additional questions. Routed to PCP

## 2024-01-19 ENCOUNTER — Encounter: Payer: Self-pay | Admitting: *Deleted

## 2024-01-19 ENCOUNTER — Ambulatory Visit: Admitting: Cardiology

## 2024-01-19 DIAGNOSIS — F322 Major depressive disorder, single episode, severe without psychotic features: Secondary | ICD-10-CM | POA: Insufficient documentation

## 2024-01-19 DIAGNOSIS — E785 Hyperlipidemia, unspecified: Secondary | ICD-10-CM | POA: Insufficient documentation

## 2024-01-19 DIAGNOSIS — Z720 Tobacco use: Secondary | ICD-10-CM | POA: Insufficient documentation

## 2024-01-19 DIAGNOSIS — N393 Stress incontinence (female) (male): Secondary | ICD-10-CM | POA: Insufficient documentation

## 2024-04-06 ENCOUNTER — Encounter: Payer: Self-pay | Admitting: Cardiology

## 2024-04-06 ENCOUNTER — Ambulatory Visit: Attending: Cardiology | Admitting: Cardiology

## 2024-04-06 VITALS — BP 108/60 | HR 67 | Ht <= 58 in | Wt 120.0 lb

## 2024-04-06 DIAGNOSIS — E1165 Type 2 diabetes mellitus with hyperglycemia: Secondary | ICD-10-CM | POA: Insufficient documentation

## 2024-04-06 DIAGNOSIS — I340 Nonrheumatic mitral (valve) insufficiency: Secondary | ICD-10-CM | POA: Diagnosis not present

## 2024-04-06 DIAGNOSIS — E782 Mixed hyperlipidemia: Secondary | ICD-10-CM | POA: Insufficient documentation

## 2024-04-06 DIAGNOSIS — R072 Precordial pain: Secondary | ICD-10-CM | POA: Diagnosis not present

## 2024-04-06 DIAGNOSIS — I10 Essential (primary) hypertension: Secondary | ICD-10-CM | POA: Diagnosis present

## 2024-04-06 NOTE — Progress Notes (Unsigned)
 " Cardiology Office Note:    Date:  04/06/2024   ID:  Cynthia Wilkinson, DOB [birth year], MRN [id number]  PCP:  Sheldon Netter, PA  Cardiologist:  Lamar Fitch, MD    Referring MD: Sheldon Netter, PA   Chief Complaint  Patient presents with   Follow-up    History of Present Illness:     Cynthia Wilkinson is a 54 y.o. female past medical history significant for heart murmur, bipolar disorder, anxiety, diabetes, essential hypertension, dyslipidemia.  Comes today to months for follow-up.  She was initially referred to us  because of heart murmur echocardiogram is being done which showed only mild mitral regurgitation.  She also supposed to have a coronary CT angio done because of multiple risk factors for coronary disease and some atypical symptomatology however it for some reason did not happen.  Comes today to my office like always with her service dog whose name is Maxs.  Majority she is feeling good but she does have some fatigue shortness of breath atypical chest pain.  Past Medical History:  Diagnosis Date   Acute bronchitis due to other specified organisms 03/13/2016   ADHD    Adjustment disorder with anxiety 09/15/2013   Last Assessment & Plan:    Using alprazolam  as needed on a fairly infrequent basis.  Continue as needed for now.     Anterior knee pain 11/30/2019   Anxiety    Aortic stenosis    Aortic valve disorder 01/12/2009   Qualifier: Diagnosis of   By: Wynetta REYNOLDS Niels KATRINA SNOMED Dx Update Oct 2024     Arthritis    Benign neoplasm of skin 09/15/2013   Bipolar disorder (HCC) 08/22/2019   Cervical myelopathy (HCC) 06/17/2019   Constipation    3/30/ 2021- frequent since injury 04/2019   DM2 (diabetes mellitus, type 2) (HCC)    on insulin , controlled   Essential hypertension    GAD (generalized anxiety disorder)    Generalized abdominal pain 03/13/2016   Head injury 05/08/2019   not sure if she lots concious   Headache    Heart murmur    when pregnant 2011    Heart murmur, systolic    Left ventricular hypertrophy 09/15/2013   MDD (major depressive disorder)    Melasma 09/15/2013   Obesity 09/15/2013   OCD (obsessive compulsive disorder)    Open angle with borderline findings and low glaucoma risk in both eyes 04/18/2016   Osteoarthritis of spine 09/15/2013   Pain in thoracic spine 05/20/2019   Panic attacks    Pes anserinus bursitis of left knee 07/16/2019   Pes anserinus bursitis of right knee 07/16/2019   Polypharmacy 06/05/2017   Primary insomnia 06/27/2017   PTSD (post-traumatic stress disorder)    Skin lesion of face 12/09/2013   Type 2 diabetes mellitus with hyperglycemia, without long-term current use of insulin  (HCC)    Xerosis cutis 09/15/2013    Past Surgical History:  Procedure Laterality Date   ANTERIOR CERVICAL DECOMP/DISCECTOMY FUSION N/A 06/17/2019   Procedure: ANTERIOR CERVICAL DECOMPRESSION/DISCECTOMY FUSION C5-7;  Surgeon: Burnetta Aures, MD;  Location: [facility] OR;  Service: Orthopedics;  Laterality: N/A;  3 hrs   CESAREAN SECTION     WISDOM TOOTH EXTRACTION     had nitroeus     Current Medications: Active Medications[1]   Allergies:   Latex, Aripiprazole, Carbamazepine, Chlordiazepoxide, Dulaglutide, Duloxetine, Fluoxetine, Gabapentin, Haloperidol, Hydromorphone, Morphine  and codeine, Pregabalin, Quetiapine, Risperidone, Sertraline, Trazodone, Valproic acid, Ziprasidone hcl, and Metformin hcl  Social History   Socioeconomic History   Marital status: Married    Spouse name: Not on file   Number of children: Not on file   Years of education: Not on file   Highest education level: Not on file  Occupational History   Not on file  Tobacco Use   Smoking status: Every Day   Smokeless tobacco: Never   Tobacco comments:     1- 3 cigs a day  Vaping Use   Vaping status: Every Day  Substance and Sexual Activity   Alcohol use: Yes    Alcohol/week: 7.0 standard drinks of alcohol    Types: 7 Glasses of wine per week     Comment: Not during pregnancy though    Drug use: No   Sexual activity: Not on file  Other Topics Concern   Not on file  Social History Narrative   Full time    Social Drivers of Health   Tobacco Use: High Risk (04/06/2024)   Patient History    Smoking Tobacco Use: Every Day    Smokeless Tobacco Use: Never    Passive Exposure: Not on file  Financial Resource Strain: Not on file  Food Insecurity: Low Risk (03/30/2024)   Received from [facility]   Epic    Within the past 12 months, you worried that your food would run out before you got money to buy more: Never true    Within the past 12 months, the food you bought just didn't last and you didn't have money to get more. : Never true  Transportation Needs: No Transportation Needs (03/30/2024)   Received from [facility]    In the past 12 months, has lack of reliable transportation kept you from medical appointments, meetings, work or from getting things needed for daily living? : No  Physical Activity: Not on file  Stress: Not on file  Social Connections: Unknown (08/02/2021)   Received from [facility]   Social Network    Social Network: Not on file  Depression (PHQ2-9): Not on file  Alcohol Screen: Not on file  Housing: Low Risk (03/30/2024)   Received from [facility]   Epic    What is your living situation today?: I have a steady place to live    Think about the place you live. Do you have problems with any of the following? Choose all that apply:: None/None on this list  Utilities: Low Risk (03/30/2024)   Received from [facility]   Utilities    In the past 12 months has the electric, gas, oil, or water company threatened to shut off services in your home? : No  Health Literacy: Not on file     Family History: The patient's family history includes Cancer in an other family member; Coronary artery disease in her father, paternal aunt, and paternal aunt; Hypertension in her mother. There is no  history of Breast cancer. ROS:   Please see the history of present illness.    All 14 point review of systems negative except as described per history of present illness  EKGs/Labs/Other Studies Reviewed:         Recent Labs: 11/13/2023: BUN 15; Creatinine, Ser 0.71; Potassium 4.6; Sodium 141  Recent Lipid Panel No results found for: CHOL, TRIG, HDL, CHOLHDL, VLDL, LDLCALC, LDLDIRECT  Physical Exam:    VS:  BP 108/60   Pulse 67   Ht 4' 8 (1.422 m)   Wt 120 lb (54.4 kg)  SpO2 99%   BMI 26.90 kg/m     Wt Readings from Last 3 Encounters:  04/06/24 120 lb (54.4 kg)  11/13/23 121 lb 1.3 oz (54.9 kg)  06/17/19 145 lb (65.8 kg)     GEN:  Well nourished, well developed in no acute distress HEENT: Normal NECK: No JVD; No carotid bruits LYMPHATICS: No lymphadenopathy CARDIAC: RRR, no murmurs, no rubs, no gallops RESPIRATORY:  Clear to auscultation without rales, wheezing or rhonchi  ABDOMEN: Soft, non-tender, non-distended MUSCULOSKELETAL:  No edema; No deformity  SKIN: Warm and dry LOWER EXTREMITIES: no swelling NEUROLOGIC:  Alert and oriented x 3 PSYCHIATRIC:  Normal affect   ASSESSMENT:    1. Nonrheumatic mitral valve regurgitation   2. Essential hypertension   3. Mixed hyperlipidemia   4. Type 2 diabetes mellitus with hyperglycemia, without long-term current use of insulin  (HCC)    PLAN:    In order of problems listed above:  Atypical chest pain, plan is to perform coronary CT angio.  New Essential hypertension blood pressure well-controlled continue present management. Dyslipidemia will wait for coronary CT angio to make final diagnosis about treatment. Diabetes that being followed by antimedicine team. In the meantime continue antiplatelet therapy   Medication Adjustments/Labs and Tests Ordered: Current medicines are reviewed at length with the patient today.  Concerns regarding medicines are outlined above.  No orders of the defined types  were placed in this encounter.  Medication changes: No orders of the defined types were placed in this encounter.   Signed, Lamar DOROTHA Fitch, MD, [facility] 04/06/2024 1:13 PM    [facility]    [1]  Current Meds  Medication Sig   ALPRAZolam  (XANAX ) 0.5 MG tablet Take 0.5 mg by mouth 2 (two) times daily.   amphetamine-dextroamphetamine (ADDERALL XR) 10 MG 24 hr capsule Take 10 mg by mouth daily as needed. (Patient taking differently: Take 10 mg by mouth 2 (two) times daily.)   amphetamine-dextroamphetamine (ADDERALL XR) 20 MG 24 hr capsule Take 20 mg by mouth every morning. (Patient taking differently: Take 20 mg by mouth 2 (two) times daily.)   aspirin EC 81 MG tablet Take 81 mg by mouth daily.   DULoxetine (CYMBALTA) 20 MG capsule Take 20 mg by mouth daily.   JARDIANCE 25 MG TABS tablet Take 25 mg by mouth daily.   lisinopril  (ZESTRIL ) 10 MG tablet Take 1 tablet (10 mg total) by mouth 2 (two) times daily.   metoprolol  tartrate (LOPRESSOR ) 100 MG tablet Take 1 tablet (100 mg total) by mouth in the morning and at bedtime.   rosuvastatin (CRESTOR) 20 MG tablet Take 20 mg by mouth daily.   [DISCONTINUED] DULoxetine (CYMBALTA) 20 MG capsule Take 20 mg by mouth 2 (two) times daily.   "

## 2024-04-06 NOTE — Patient Instructions (Signed)
 Medication Instructions:  Your physician recommends that you continue on your current medications as directed. Please refer to the Current Medication list given to you today.  *If you need a refill on your cardiac medications before your next appointment, please call your pharmacy*   Lab Work: None Ordered If you have labs (blood work) drawn today and your tests are completely normal, you will receive your results only by: MyChart Message (if you have MyChart) OR A paper copy in the mail If you have any lab test that is abnormal or we need to change your treatment, we will call you to review the results.   Testing/Procedures:  Your cardiac CT will be scheduled at one of the below locations:   [facility] [address] [place], [place] [address]  Please follow these instructions carefully (unless otherwise directed):     On the Night Before the Test: Be sure to Drink plenty of water. Do not consume any caffeinated/decaffeinated beverages or chocolate 12 hours prior to your test. Do not take any antihistamines 12 hours prior to your test.   On the Day of the Test: Drink plenty of water until 1 hour prior to the test. Do not eat any food 4 hours prior to the test. You may take your regular medications prior to the test.  Take metoprolol  (Lopressor ) two hours prior to test. FEMALES- please wear underwire-free bra if available, avoid dresses & tight clothing      After the Test: Drink plenty of water. After receiving IV contrast, you may experience a mild flushed feeling. This is normal. On occasion, you may experience a mild rash up to 24 hours after the test. This is not dangerous. If this occurs, you can take Benadryl  25 mg and increase your fluid intake. If you experience trouble breathing, this can be serious. If it is severe call 911 IMMEDIATELY. If it is mild, please call our office. If you take any of these medications: Glipizide/Metformin, Avandament,  Glucavance, please do not take 48 hours after completing test unless otherwise instructed.  We will call to schedule your test 2-4 weeks out understanding that some insurance companies will need an authorization prior to the service being performed.   For non-scheduling related questions, please contact the cardiac imaging nurse navigator should you have any questions/concerns: Camie Shutter, Cardiac Imaging Nurse Navigator Chantal Requena, Cardiac Imaging Nurse Navigator [facility] Direct Office Dial: [phone]   For scheduling needs, including cancellations and rescheduling, please call Brittany, [phone].    Follow-Up: At [facility], you and your health needs are our priority.  As part of our continuing mission to provide you with exceptional heart care, we have created designated Provider Care Teams.  These Care Teams include your primary Cardiologist (physician) and Advanced Practice Providers (APPs -  Physician Assistants and Nurse Practitioners) who all work together to provide you with the care you need, when you need it.  We recommend signing up for the patient portal called MyChart.  Sign up information is provided on this After Visit Summary.  MyChart is used to connect with patients for Virtual Visits (Telemedicine).  Patients are able to view lab/test results, encounter notes, upcoming appointments, etc.  Non-urgent messages can be sent to your provider as well.   To learn more about what you can do with MyChart, go to [email or web address].    Your next appointment:   3 month(s)  The format for your next appointment:   In  Person  Provider:   Lamar Fitch, MD    Other Instructions NA

## 2024-04-19 ENCOUNTER — Telehealth (HOSPITAL_COMMUNITY): Payer: Self-pay | Admitting: Emergency Medicine

## 2024-04-19 NOTE — Telephone Encounter (Signed)
 Attempted to call patient regarding upcoming cardiac CT appointment. Left message on voicemail with name and callback number Rockwell Alexandria RN Navigator Cardiac Imaging [facility] [phone] Office [phone] Cell

## 2024-04-20 ENCOUNTER — Ambulatory Visit (HOSPITAL_BASED_OUTPATIENT_CLINIC_OR_DEPARTMENT_OTHER)
Admission: RE | Admit: 2024-04-20 | Discharge: 2024-04-20 | Disposition: A | Source: Ambulatory Visit | Attending: Cardiology | Admitting: Cardiology

## 2024-04-20 ENCOUNTER — Encounter (HOSPITAL_BASED_OUTPATIENT_CLINIC_OR_DEPARTMENT_OTHER): Payer: Self-pay

## 2024-04-20 DIAGNOSIS — R072 Precordial pain: Secondary | ICD-10-CM

## 2024-04-20 LAB — POCT I-STAT CREATININE: Creatinine, Ser: 0.7 mg/dL (ref 0.44–1.00)

## 2024-04-20 MED ORDER — NITROGLYCERIN 0.4 MG SL SUBL
0.8000 mg | SUBLINGUAL_TABLET | Freq: Once | SUBLINGUAL | Status: AC
  Administered 2024-04-20: 0.8 mg via SUBLINGUAL

## 2024-04-20 MED ORDER — IOHEXOL 350 MG/ML SOLN
100.0000 mL | Freq: Once | INTRAVENOUS | Status: AC | PRN
  Administered 2024-04-20: 95 mL via INTRAVENOUS

## 2024-04-21 ENCOUNTER — Other Ambulatory Visit: Payer: Self-pay | Admitting: Cardiology

## 2024-04-21 ENCOUNTER — Ambulatory Visit: Payer: Self-pay | Admitting: Cardiology

## 2024-04-21 ENCOUNTER — Ambulatory Visit (HOSPITAL_BASED_OUTPATIENT_CLINIC_OR_DEPARTMENT_OTHER)
Admission: RE | Admit: 2024-04-21 | Discharge: 2024-04-21 | Disposition: A | Source: Ambulatory Visit | Attending: Cardiology | Admitting: Cardiology

## 2024-04-21 ENCOUNTER — Telehealth: Payer: Self-pay

## 2024-04-21 DIAGNOSIS — R931 Abnormal findings on diagnostic imaging of heart and coronary circulation: Secondary | ICD-10-CM

## 2024-04-21 NOTE — Telephone Encounter (Signed)
CT AngioResults reviewed with pt as per Dr. Krasowski's note.  Pt verbalized understanding and had no additional questions. Routed to PCP  

## 2024-04-21 NOTE — Progress Notes (Signed)
 Plaque analysis order

## 2024-04-21 NOTE — Progress Notes (Signed)
FFR Orders 

## 2024-07-15 ENCOUNTER — Ambulatory Visit: Admitting: Cardiology
